# Patient Record
Sex: Female | Born: 1960
Health system: Southern US, Community
[De-identification: ages and names within clinical notes are randomized; demographics above are authoritative.]

## PROBLEM LIST (undated history)

## (undated) DIAGNOSIS — Z87898 Personal history of other specified conditions: Secondary | ICD-10-CM

## (undated) DIAGNOSIS — Z87448 Personal history of other diseases of urinary system: Secondary | ICD-10-CM

## (undated) DIAGNOSIS — I10 Essential (primary) hypertension: Secondary | ICD-10-CM

## (undated) DIAGNOSIS — K219 Gastro-esophageal reflux disease without esophagitis: Secondary | ICD-10-CM

## (undated) DIAGNOSIS — M1990 Unspecified osteoarthritis, unspecified site: Secondary | ICD-10-CM

## (undated) HISTORY — PX: DILATION AND CURETTAGE OF UTERUS: SHX78

## (undated) HISTORY — DX: Essential (primary) hypertension: I10

## (undated) HISTORY — DX: Personal history of other diseases of urinary system: Z87.448

## (undated) HISTORY — DX: Gastro-esophageal reflux disease without esophagitis: K21.9

## (undated) HISTORY — DX: Personal history of other specified conditions: Z87.898

---

## 1998-01-13 HISTORY — PX: LAPAROSCOPIC OVARIAN CYSTECTOMY: SUR786

## 1999-01-14 HISTORY — PX: DILATION AND CURETTAGE OF UTERUS: SHX78

## 2005-11-20 ENCOUNTER — Ambulatory Visit (HOSPITAL_COMMUNITY): Admission: RE | Admit: 2005-11-20 | Discharge: 2005-11-20 | Payer: Self-pay | Admitting: Family Medicine

## 2008-02-11 ENCOUNTER — Encounter: Admission: RE | Admit: 2008-02-11 | Discharge: 2008-02-11 | Payer: Self-pay | Admitting: Family Medicine

## 2008-12-08 ENCOUNTER — Emergency Department (HOSPITAL_COMMUNITY): Admission: EM | Admit: 2008-12-08 | Discharge: 2008-12-08 | Payer: Self-pay | Admitting: Emergency Medicine

## 2010-02-03 ENCOUNTER — Encounter: Payer: Self-pay | Admitting: Family Medicine

## 2010-04-17 LAB — PREGNANCY, URINE: Preg Test, Ur: NEGATIVE

## 2010-04-17 LAB — URINALYSIS, ROUTINE W REFLEX MICROSCOPIC
Glucose, UA: NEGATIVE mg/dL
Leukocytes, UA: NEGATIVE
Protein, ur: NEGATIVE mg/dL
Specific Gravity, Urine: 1.015 (ref 1.005–1.030)
Urobilinogen, UA: 0.2 mg/dL (ref 0.0–1.0)

## 2010-04-17 LAB — CBC
MCHC: 33.9 g/dL (ref 30.0–36.0)
RBC: 4.51 MIL/uL (ref 3.87–5.11)
WBC: 16.2 10*3/uL — ABNORMAL HIGH (ref 4.0–10.5)

## 2010-04-17 LAB — DIFFERENTIAL
Basophils Relative: 0 % (ref 0–1)
Eosinophils Absolute: 0.1 10*3/uL (ref 0.0–0.7)
Eosinophils Relative: 0 % (ref 0–5)
Lymphs Abs: 2.5 10*3/uL (ref 0.7–4.0)
Monocytes Relative: 6 % (ref 3–12)
Neutro Abs: 12.5 10*3/uL — ABNORMAL HIGH (ref 1.7–7.7)

## 2010-04-17 LAB — COMPREHENSIVE METABOLIC PANEL
BUN: 8 mg/dL (ref 6–23)
CO2: 23 mEq/L (ref 19–32)
Creatinine, Ser: 0.83 mg/dL (ref 0.4–1.2)
Glucose, Bld: 97 mg/dL (ref 70–99)
Potassium: 3.2 mEq/L — ABNORMAL LOW (ref 3.5–5.1)
Sodium: 135 mEq/L (ref 135–145)
Total Bilirubin: 0.4 mg/dL (ref 0.3–1.2)
Total Protein: 8.2 g/dL (ref 6.0–8.3)

## 2010-04-17 LAB — LIPASE, BLOOD: Lipase: 17 U/L (ref 11–59)

## 2010-04-17 LAB — URINE MICROSCOPIC-ADD ON

## 2010-06-17 ENCOUNTER — Other Ambulatory Visit: Payer: Self-pay | Admitting: Family Medicine

## 2010-06-17 DIAGNOSIS — Z1231 Encounter for screening mammogram for malignant neoplasm of breast: Secondary | ICD-10-CM

## 2010-07-03 ENCOUNTER — Ambulatory Visit
Admission: RE | Admit: 2010-07-03 | Discharge: 2010-07-03 | Disposition: A | Discharge status: Home / Self Care | Payer: BC Managed Care – PPO | Source: Ambulatory Visit | Attending: Family Medicine | Admitting: Family Medicine

## 2010-07-03 DIAGNOSIS — Z1231 Encounter for screening mammogram for malignant neoplasm of breast: Secondary | ICD-10-CM

## 2010-08-22 ENCOUNTER — Encounter (HOSPITAL_COMMUNITY): Payer: Self-pay | Admitting: *Deleted

## 2010-09-05 ENCOUNTER — Ambulatory Visit (HOSPITAL_COMMUNITY): Payer: BC Managed Care – PPO | Admitting: Anesthesiology

## 2010-09-05 ENCOUNTER — Encounter (HOSPITAL_COMMUNITY): Payer: Self-pay | Admitting: Anesthesiology

## 2010-09-05 ENCOUNTER — Ambulatory Visit (HOSPITAL_COMMUNITY)
Admission: RE | Admit: 2010-09-05 | Discharge: 2010-09-05 | Disposition: A | Discharge status: Home / Self Care | Payer: BC Managed Care – PPO | Source: Ambulatory Visit | Attending: Obstetrics & Gynecology | Admitting: Obstetrics & Gynecology

## 2010-09-05 ENCOUNTER — Other Ambulatory Visit: Payer: Self-pay | Admitting: Obstetrics & Gynecology

## 2010-09-05 ENCOUNTER — Encounter (HOSPITAL_COMMUNITY)
Admission: RE | Disposition: A | Discharge status: Home / Self Care | Payer: Self-pay | Source: Ambulatory Visit | Attending: Obstetrics & Gynecology

## 2010-09-05 DIAGNOSIS — N840 Polyp of corpus uteri: Secondary | ICD-10-CM | POA: Insufficient documentation

## 2010-09-05 DIAGNOSIS — N920 Excessive and frequent menstruation with regular cycle: Secondary | ICD-10-CM | POA: Insufficient documentation

## 2010-09-05 DIAGNOSIS — Z30430 Encounter for insertion of intrauterine contraceptive device: Secondary | ICD-10-CM | POA: Insufficient documentation

## 2010-09-05 HISTORY — DX: Unspecified osteoarthritis, unspecified site: M19.90

## 2010-09-05 HISTORY — PX: HYSTEROSCOPY WITH D & C: SHX1775

## 2010-09-05 HISTORY — PX: IUD REMOVAL: SHX5392

## 2010-09-05 LAB — CBC
Hemoglobin: 12 g/dL (ref 12.0–15.0)
MCHC: 32.9 g/dL (ref 30.0–36.0)
MCV: 89.2 fL (ref 78.0–100.0)
Platelets: 387 10*3/uL (ref 150–400)
RDW: 14.4 % (ref 11.5–15.5)

## 2010-09-05 LAB — PREGNANCY, URINE: Preg Test, Ur: NEGATIVE

## 2010-09-05 SURGERY — DILATATION AND CURETTAGE /HYSTEROSCOPY
Anesthesia: General | Site: Uterus | Wound class: Clean Contaminated

## 2010-09-05 MED ORDER — KETOROLAC TROMETHAMINE 30 MG/ML IJ SOLN
INTRAMUSCULAR | Status: AC
  Administered 2010-09-05: 30 mg via INTRAVENOUS
  Filled 2010-09-05: qty 1

## 2010-09-05 MED ORDER — ONDANSETRON HCL 4 MG/2ML IJ SOLN
INTRAMUSCULAR | Status: DC | PRN
  Administered 2010-09-05: 4 mg via INTRAVENOUS

## 2010-09-05 MED ORDER — FENTANYL CITRATE 0.05 MG/ML IJ SOLN
INTRAMUSCULAR | Status: AC
  Filled 2010-09-05: qty 2

## 2010-09-05 MED ORDER — MIDAZOLAM HCL 5 MG/5ML IJ SOLN
INTRAMUSCULAR | Status: DC | PRN
  Administered 2010-09-05: 2 mg via INTRAVENOUS

## 2010-09-05 MED ORDER — LIDOCAINE HCL (CARDIAC) 20 MG/ML IV SOLN
INTRAVENOUS | Status: DC | PRN
  Administered 2010-09-05: 50 mg via INTRAVENOUS

## 2010-09-05 MED ORDER — LACTATED RINGERS IV SOLN
INTRAVENOUS | Status: DC
  Administered 2010-09-05 (×2): via INTRAVENOUS

## 2010-09-05 MED ORDER — DEXAMETHASONE SODIUM PHOSPHATE 10 MG/ML IJ SOLN
INTRAMUSCULAR | Status: AC
  Filled 2010-09-05: qty 1

## 2010-09-05 MED ORDER — ONDANSETRON HCL 4 MG/2ML IJ SOLN
INTRAMUSCULAR | Status: AC
  Filled 2010-09-05: qty 2

## 2010-09-05 MED ORDER — PROPOFOL 10 MG/ML IV EMUL
INTRAVENOUS | Status: AC
  Filled 2010-09-05: qty 20

## 2010-09-05 MED ORDER — FENTANYL CITRATE 0.05 MG/ML IJ SOLN
INTRAMUSCULAR | Status: DC | PRN
  Administered 2010-09-05 (×2): 50 ug via INTRAVENOUS

## 2010-09-05 MED ORDER — PROPOFOL 10 MG/ML IV EMUL
INTRAVENOUS | Status: DC | PRN
  Administered 2010-09-05: 200 mg via INTRAVENOUS

## 2010-09-05 MED ORDER — FENTANYL CITRATE 0.05 MG/ML IJ SOLN: 25.0000 ug | INTRAMUSCULAR | Status: DC | PRN

## 2010-09-05 MED ORDER — LIDOCAINE HCL 1 % IJ SOLN
INTRAMUSCULAR | Status: DC | PRN
  Administered 2010-09-05: 10 mL

## 2010-09-05 MED ORDER — KETOROLAC TROMETHAMINE 30 MG/ML IJ SOLN
30.0000 mg | INTRAMUSCULAR | Status: AC
  Administered 2010-09-05: 30 mg via INTRAVENOUS

## 2010-09-05 MED ORDER — LIDOCAINE HCL (CARDIAC) 20 MG/ML IV SOLN
INTRAVENOUS | Status: AC
  Filled 2010-09-05: qty 5

## 2010-09-05 MED ORDER — MIDAZOLAM HCL 2 MG/2ML IJ SOLN
INTRAMUSCULAR | Status: AC
  Filled 2010-09-05: qty 2

## 2010-09-05 MED ORDER — GLYCINE 1.5 % IR SOLN
Status: DC | PRN
  Administered 2010-09-05: 3000 mL

## 2010-09-05 SURGICAL SUPPLY — 20 items
CANISTER SUCTION 2500CC (MISCELLANEOUS) ×4 IMPLANT
CATH ROBINSON RED A/P 16FR (CATHETERS) ×4 IMPLANT
CLOTH BEACON ORANGE TIMEOUT ST (SAFETY) ×4 IMPLANT
CONTAINER PREFILL 10% NBF 60ML (FORM) ×4 IMPLANT
DRAPE UTILITY XL STRL (DRAPES) ×8 IMPLANT
ELECT REM PT RETURN 9FT ADLT (ELECTROSURGICAL) ×4
ELECTRODE REM PT RTRN 9FT ADLT (ELECTROSURGICAL) ×3 IMPLANT
ELECTRODE ROLLER BARREL 22FR (ELECTROSURGICAL) IMPLANT
ELECTRODE ROLLER VERSAPOINT (ELECTRODE) IMPLANT
ELECTRODE RT ANGLE VERSAPOINT (CUTTING LOOP) IMPLANT
ELECTRODE VAPORCUT 22FR (ELECTROSURGICAL) IMPLANT
GLOVE BIO SURGEON STRL SZ7 (GLOVE) ×8 IMPLANT
GLOVE BIOGEL PI IND STRL 7.0 (GLOVE) ×3 IMPLANT
GLOVE BIOGEL PI INDICATOR 7.0 (GLOVE) ×1
GOWN PREVENTION PLUS LG XLONG (DISPOSABLE) ×8 IMPLANT
LOOP ANGLED CUTTING 22FR (CUTTING LOOP) IMPLANT
PACK HYSTEROSCOPY LF (CUSTOM PROCEDURE TRAY) ×4 IMPLANT
TOWEL OR 17X24 6PK STRL BLUE (TOWEL DISPOSABLE) ×8 IMPLANT
WATER STERILE IRR 1000ML POUR (IV SOLUTION) ×4 IMPLANT
intrauterine device (Coils) ×4 IMPLANT

## 2010-09-05 NOTE — Anesthesia Procedure Notes (Signed)
Procedure Name: LMA Insertion Date/Time: 09/05/2010 12:16 PM Performed by: Madison Hickman Pre-anesthesia Checklist: Patient identified, Emergency Drugs available, Suction available, Patient being monitored and Timeout performed Patient Re-evaluated:Patient Re-evaluated prior to inductionOxygen Delivery Method: Circle System Utilized Preoxygenation: Pre-oxygenation with 100% oxygen Intubation Type: IV induction Ventilation: Mask ventilation without difficulty LMA Size: 3.0 Placement Confirmation: positive ETCO2 and breath sounds checked- equal and bilateral Dental Injury: Teeth and Oropharynx as per pre-operative assessment

## 2010-09-05 NOTE — Op Note (Signed)
Preop diagnosis: Menorrhagia,  endometrial polyp, Mirena IUD desired for contraception as well.  Postop diagnosis: same Procedure:  D&C, hysteroscopic polypectomy, Mirena IUD insertion Surgeon Dr. Shea Evans, MD Asst. None Anesthesia Gen. And paracervical block IV fluids LR EBL minimal Irrigation fluid deficit minimal Complications none Condition stable Disposition PACU Specimen endometrial polyp and endometrial curettings  Procedure Patient is 50 year old woman with menorrhagia and ultrasound noting endometrial polyp. Patient also desired contraception.  Options for menorrhagia as well as contraception options reviewed; decision was made to proceed with Mirena IUD insertion after polypectomy. Percent completion of surgery including infection bleeding damage to internal organs perforation were reviewed. Patient given phone consent she was brought to the operating room with IV running. She underwent general anesthesia without difficulty and was given dorsal lithotomy position. Parts prepped and draped in standard fashion. Bladder was emptied with straight catheter. Examination under anesthesia revealed anteverted normal size uterus. Speculum inserted in the vagina, Cervix was grasped with single-tooth tenaculum, Uterus sounded to 8 cm. Paracervical block with 1% plain lidocaine was given (total 10 cc). Cervical os was dilated to #23 [place] dilator. Hysteroscope was introduced in the uterine cavity under visualization. Fundal endometrial polyp was noted. Rest of endometrium appeared normal with normal appearing tubal ostia. Cervical canal appear normal.  Hysteroscopic polypectomy was done with scissors, polyp was grasped with polyp forceps, and brought out along with the hysteroscope. Specimen endometrial polyp. Curetting of the endometrial cavity was done with sharp curette, specimen sent to pathology. Mirena IUD was inserted in standard fashion at the fundus. Strings were cut short to you about  2-1/2 cm at the cervical os.  All instruments were removed. All counts correct x2.  No complications. Patient tolerated surgery well  Patient was recover from anesthesia brought to the recovery room in stable condition. Surgical findings reviewed with patient and her husband. Followup in office in 2 weeks.

## 2010-09-05 NOTE — Transfer of Care (Signed)
  Anesthesia Post-op Note  Patient: Emily Aguirre  Procedure(s) Performed:  INTRAUTERINE DEVICE (IUD) REMOVAL - Mirena IUD insertion.; DILATATION AND CURETTAGE (D&C) /HYSTEROSCOPY - Dilataion & Currettage With Hysteroscopy; Polypectomy  Patient Location: PACU  Anesthesia Type: General  Level of Consciousness: awake, alert  and oriented  Airway and Oxygen Therapy: Patient Spontanous Breathing and Patient connected to nasal cannula oxygen  Post-op Pain: none  Post-op Assessment: Post-op Vital signs reviewed and Patient's Cardiovascular Status Stable  Post-op Vital Signs: Reviewed and stable  Complications: No apparent anesthesia complications

## 2010-09-05 NOTE — Anesthesia Preprocedure Evaluation (Signed)
Anesthesia Evaluation  Name, MR# and DOB Patient awake  General Assessment Comment  Reviewed: Allergy & Precautions, H&P , NPO status , Patient's Chart, lab work & pertinent test results, reviewed documented beta blocker date and time   History of Anesthesia Complications Negative for: history of anesthetic complications  Airway Mallampati: II TM Distance: >3 FB Neck ROM: full    Dental  (+) Teeth Intact,    Pulmonary  clear to auscultation  breath sounds clear to auscultation none    Cardiovascular Exercise Tolerance: Good regular Normal    Neuro/Psych Negative Neurological ROS  Negative Psych ROS  GI/Hepatic/Renal negative GI ROS  negative Liver ROS  negative Renal ROS        Endo/Other  Negative Endocrine ROS (+)   Morbid obesity  Abdominal   Musculoskeletal  (+) Arthritis - (bilateral knees), Osteoarthritis,    Hematology negative hematology ROS (+)   Peds  Reproductive/Obstetrics negative OB ROS    Anesthesia Other Findings             Anesthesia Physical Anesthesia Plan  ASA: II  Anesthesia Plan: General   Post-op Pain Management:    Induction:   Airway Management Planned: LMA  Additional Equipment:   Intra-op Plan:   Post-operative Plan:   Informed Consent: I have reviewed the patients History and Physical, chart, labs and discussed the procedure including the risks, benefits and alternatives for the proposed anesthesia with the patient or authorized representative who has indicated his/her understanding and acceptance.   Dental advisory given  Plan Discussed with: CRNA and Surgeon  Anesthesia Plan Comments:         Anesthesia Quick Evaluation

## 2010-09-05 NOTE — H&P (Signed)
Emily Aguirre is an 49 y.o. female. With menorrhagia. Sono noted possible submucosal myoma v/s polyp. Options reviewed, desired Mirena IUD or contraceptive benefit as well.  No prior PID/ abn paps. Prior ovarian cyst L/scopy and D&C for menorrhagia.  No breast complaints, normal Mammo in past.    Menstrual History: Heavy,, clots, but regular, every month.  Patient's last menstrual period was 08/12/2010.    Past Medical History  Diagnosis Date  . Arthritis     bil knees    Past Surgical History  Procedure Date  . Laparoscopic ovarian cystectomy 2000  . Dilation and curettage of uterus 2001    D&E    History reviewed. No pertinent family history.  Social History:  reports that she has never smoked. She does not have any smokeless tobacco history on file. She reports that she does not drink alcohol or use illicit drugs.  Allergies: No Known Allergies  Prescriptions prior to admission  Medication Sig Dispense Refill  . amoxicillin (AMOXIL) 500 MG capsule Take 500 mg by mouth every 6 (six) hours. Taking this after a root canal.  Should be completed on 08/25/10       . ergocalciferol (VITAMIN D2) 50000 UNITS capsule Take 50,000 Units by mouth once a week. Every Tuesday        . HYDROcodone-acetaminophen (VICODIN) 5-500 MG per tablet Take 1 tablet by mouth every 6 (six) hours as needed. For root canal pain       . ibuprofen (ADVIL,MOTRIN) 800 MG tablet Take 800 mg by mouth every 8 (eight) hours as needed. For tooth pain after root canal       . medroxyPROGESTERone (PROVERA) 5 MG tablet Take 5 mg by mouth daily. Until surgery         Denies SOB/chest pain/ HA/ vision changes/ LE pain or swelling/ etc.  Blood pressure 150/82, pulse 89, temperature 97.9 F (36.6 C), temperature source Oral, resp. rate 16, height 5\' 1"  (1.549 m), weight 92.534 kg (204 lb), last menstrual period 08/12/2010, SpO2 99.00%. Physical exam:  A&O x 3, no acute distress. Pleasant HEENT neg, no  thyromegaly Lungs CTA bilat CV RRR, A1S2 normal Abdo soft, non tender, non acute Extr no edema/ tenderness Pelvic Slightly enlarged uterus with submucosal myoma/polyp  Results for orders placed during the hospital encounter of 09/05/10 (from the past 24 hour(s))  CBC     Status: Normal   Collection Time   09/05/10  8:52 AM      Component Value Range   WBC 7.9  4.0 - 10.5 (K/uL)   RBC 4.09  3.87 - 5.11 (MIL/uL)   Hemoglobin 12.0  12.0 - 15.0 (g/dL)   HCT 24.4  01.0 - 27.2 (%)   MCV 89.2  78.0 - 100.0 (fL)   MCH 29.3  26.0 - 34.0 (pg)   MCHC 32.9  30.0 - 36.0 (g/dL)   RDW 53.6  64.4 - 03.4 (%)   Platelets 387  150 - 400 (K/uL)  PREGNANCY, URINE     Status: Normal   Collection Time   09/05/10  9:02 AM      Component Value Range   Preg Test, Ur NEGATIVE      No results found.  Assessment/Plan: Menorrhagia. Plan D&C, hysteroscopy, resection of myoma/polyp and Mirena IUD insertion.  Risks/complications/benefits and alternatives reviewed, patient understands and agree.s   Emily Aguirre 09/05/2010, 11:41 AM

## 2010-09-05 NOTE — Anesthesia Postprocedure Evaluation (Signed)
Anesthesia Post Note  Patient: Emily Aguirre  Procedure(s) Performed:  INTRAUTERINE DEVICE (IUD) REMOVAL - Mirena IUD insertion.; DILATATION AND CURETTAGE (D&C) /HYSTEROSCOPY - Dilataion & Currettage With Hysteroscopy; Polypectomy  Anesthesia type: GA  Patient location: PACU  Post pain: Pain level controlled  Post assessment: Post-op Vital signs reviewed  Last Vitals:  Filed Vitals:   09/05/10 1315  BP: 115/65  Pulse: 82  Temp:   Resp: 20    Post vital signs: Reviewed  Level of consciousness: sedated  Complications: No apparent anesthesia complications

## 2010-09-24 ENCOUNTER — Encounter (HOSPITAL_COMMUNITY): Payer: Self-pay | Admitting: Obstetrics & Gynecology

## 2012-04-16 ENCOUNTER — Encounter (HOSPITAL_COMMUNITY): Payer: Self-pay | Admitting: Emergency Medicine

## 2012-04-16 ENCOUNTER — Emergency Department (HOSPITAL_COMMUNITY)
Admission: EM | Admit: 2012-04-16 | Discharge: 2012-04-16 | Disposition: A | Discharge status: Home / Self Care | Payer: 59 | Attending: Emergency Medicine | Admitting: Emergency Medicine

## 2012-04-16 DIAGNOSIS — Z8739 Personal history of other diseases of the musculoskeletal system and connective tissue: Secondary | ICD-10-CM | POA: Insufficient documentation

## 2012-04-16 DIAGNOSIS — R197 Diarrhea, unspecified: Secondary | ICD-10-CM | POA: Insufficient documentation

## 2012-04-16 DIAGNOSIS — Z79899 Other long term (current) drug therapy: Secondary | ICD-10-CM | POA: Insufficient documentation

## 2012-04-16 DIAGNOSIS — R319 Hematuria, unspecified: Secondary | ICD-10-CM | POA: Insufficient documentation

## 2012-04-16 DIAGNOSIS — R112 Nausea with vomiting, unspecified: Secondary | ICD-10-CM

## 2012-04-16 DIAGNOSIS — N390 Urinary tract infection, site not specified: Secondary | ICD-10-CM

## 2012-04-16 DIAGNOSIS — R109 Unspecified abdominal pain: Secondary | ICD-10-CM | POA: Insufficient documentation

## 2012-04-16 LAB — URINALYSIS, ROUTINE W REFLEX MICROSCOPIC
Glucose, UA: NEGATIVE mg/dL
Ketones, ur: NEGATIVE mg/dL
Protein, ur: NEGATIVE mg/dL
pH: 6.5 (ref 5.0–8.0)

## 2012-04-16 LAB — URINE MICROSCOPIC-ADD ON

## 2012-04-16 MED ORDER — ONDANSETRON HCL 4 MG/2ML IJ SOLN
4.0000 mg | Freq: Once | INTRAMUSCULAR | Status: AC
  Administered 2012-04-16: 4 mg via INTRAVENOUS
  Filled 2012-04-16: qty 2

## 2012-04-16 MED ORDER — HYDROMORPHONE HCL PF 1 MG/ML IJ SOLN
1.0000 mg | Freq: Once | INTRAMUSCULAR | Status: AC
  Administered 2012-04-16: 1 mg via INTRAVENOUS
  Filled 2012-04-16: qty 1

## 2012-04-16 MED ORDER — ONDANSETRON 4 MG PO TBDP: 4.0000 mg | ORAL_TABLET | Freq: Three times a day (TID) | ORAL | Status: DC | PRN

## 2012-04-16 MED ORDER — ONDANSETRON HCL 4 MG/2ML IJ SOLN
4.0000 mg | Freq: Once | INTRAMUSCULAR | Status: DC
  Filled 2012-04-16: qty 2

## 2012-04-16 MED ORDER — CIPROFLOXACIN HCL 250 MG PO TABS
500.0000 mg | ORAL_TABLET | Freq: Once | ORAL | Status: AC
  Administered 2012-04-16: 500 mg via ORAL
  Filled 2012-04-16: qty 2

## 2012-04-16 MED ORDER — ONDANSETRON HCL 4 MG/2ML IJ SOLN
4.0000 mg | Freq: Once | INTRAMUSCULAR | Status: AC
  Administered 2012-04-16: 4 mg via INTRAVENOUS

## 2012-04-16 MED ORDER — SODIUM CHLORIDE 0.9 % IV BOLUS (SEPSIS)
1000.0000 mL | Freq: Once | INTRAVENOUS | Status: AC
  Administered 2012-04-16: 1000 mL via INTRAVENOUS

## 2012-04-16 MED ORDER — HYDROCODONE-ACETAMINOPHEN 5-325 MG PO TABS: 1.0000 | ORAL_TABLET | ORAL | Status: DC | PRN

## 2012-04-16 MED ORDER — CIPROFLOXACIN HCL 250 MG PO TABS: 250.0000 mg | ORAL_TABLET | Freq: Two times a day (BID) | ORAL | Status: DC

## 2012-04-16 MED ORDER — SODIUM CHLORIDE 0.9 % IV SOLN: Freq: Once | INTRAVENOUS | Status: DC

## 2012-04-16 NOTE — ED Provider Notes (Signed)
History     CSN: [id number]  Arrival date & time 04/16/12  0251   First MD Initiated Contact with Patient 04/16/12 [phone]      Chief Complaint  Patient presents with  . Urinary Tract Infection    (Consider location/radiation/quality/duration/timing/severity/associated sxs/prior treatment) HPI  Past Medical History  Diagnosis Date  . Arthritis     bil knees    Past Surgical History  Procedure Laterality Date  . Laparoscopic ovarian cystectomy  2000  . Dilation and curettage of uterus  2001    D&E  . Iud removal  09/05/2010    Procedure: INTRAUTERINE DEVICE (IUD) REMOVAL;  Surgeon: Robley Fries;  Location: [facility];  Service: Gynecology;  Laterality: N/A;  Mirena IUD insertion.  Marland Kitchen Hysteroscopy w/d&c  09/05/2010    Procedure: DILATATION AND CURETTAGE (D&C) /HYSTEROSCOPY;  Surgeon: Robley Fries;  Location: [facility];  Service: Gynecology;  Laterality: Bilateral;  Dilataion & Currettage With Hysteroscopy; Polypectomy    No family history on file.  History  Substance Use Topics  . Smoking status: Never Smoker   . Smokeless tobacco: Not on file  . Alcohol Use: No    OB History   Grav Para Term Preterm Abortions TAB SAB Ect Mult Living                  Review of Systems  Allergies  Review of patient's allergies indicates no known allergies.  Home Medications   Current Outpatient Rx  Name  Route  Sig  Dispense  Refill  . ergocalciferol (VITAMIN D2) 50000 UNITS capsule   Oral   Take 50,000 Units by mouth once a week. Every Tuesday           . ibuprofen (ADVIL,MOTRIN) 800 MG tablet   Oral   Take 800 mg by mouth every 8 (eight) hours as needed. For tooth pain after root canal            BP 135/80  Pulse 119  Temp(Src) 99.3 F (37.4 C) (Oral)  Resp 20  Ht 5\' 1"  (1.549 m)  Wt 218 lb (98.884 kg)  BMI 41.21 kg/m2  SpO2 99%  LMP 04/02/2012  Physical Exam  ED Course  Procedures (including critical care time) Results for orders placed during the  hospital encounter of 04/16/12  URINALYSIS, ROUTINE W REFLEX MICROSCOPIC      Result Value Range   Color, Urine YELLOW  YELLOW   APPearance CLEAR  CLEAR   Specific Gravity, Urine 1.010  1.005 - 1.030   pH 6.5  5.0 - 8.0   Glucose, UA NEGATIVE  NEGATIVE mg/dL   Hgb urine dipstick LARGE (*) NEGATIVE   Bilirubin Urine NEGATIVE  NEGATIVE   Ketones, ur NEGATIVE  NEGATIVE mg/dL   Protein, ur NEGATIVE  NEGATIVE mg/dL   Urobilinogen, UA 0.2  0.0 - 1.0 mg/dL   Nitrite NEGATIVE  NEGATIVE   Leukocytes, UA SMALL (*) NEGATIVE  URINE MICROSCOPIC-ADD ON      Result Value Range   Squamous Epithelial / LPF RARE  RARE   WBC, UA TOO NUMEROUS TO COUNT  <3 WBC/hpf   RBC / HPF 3-6  <3 RBC/hpf   Bacteria, UA RARE  RARE      0445 Patient with continued nausea. Has been given dilaudid and zofran. Will give additional zofran. Pain is gone. 0532 Nausea is better after the second dose of zofran. MDM  Patient with abdominal pain, headache, nausea, vomiting and diarrhea. UA with evidence of infection.  Given IVF, dilaudid, zofran x 2, ciprofloxacin. Reviewed results with patient. Pt stable in ED with no significant deterioration in condition.The patient appears reasonably screened and/or stabilized for discharge and I doubt any other medical condition or other [facility] requiring further screening, evaluation, or treatment in the ED at this time prior to discharge.  MDM Reviewed: nursing note and vitals Interpretation: labs           Nicoletta Dress. Colon Branch, MD 04/16/12 [phone]

## 2012-04-16 NOTE — ED Notes (Signed)
Pt c/o pain with urination, lower back pain, and nausea.

## 2012-04-16 NOTE — ED Provider Notes (Signed)
History     CSN: [id number]  Arrival date & time 04/16/12  0251   First MD Initiated Contact with Patient 04/16/12 [phone]      Chief Complaint  Patient presents with  . Urinary Tract Infection    (Consider location/radiation/quality/duration/timing/severity/associated sxs/prior treatment) HPI Emily Aguirre is a 52 y.o. female who presents to the Emergency Department complaining of  Right sided flank pain, urine with blood since Monday, nausea, and vomiting. Vomiting began tonight. Denies fever, chills.  PCP Dr. Tanya Nones  Past Medical History  Diagnosis Date  . Arthritis     bil knees    Past Surgical History  Procedure Laterality Date  . Laparoscopic ovarian cystectomy  2000  . Dilation and curettage of uterus  2001    D&E  . Iud removal  09/05/2010    Procedure: INTRAUTERINE DEVICE (IUD) REMOVAL;  Surgeon: Robley Fries;  Location: [facility];  Service: Gynecology;  Laterality: N/A;  Mirena IUD insertion.  Marland Kitchen Hysteroscopy w/d&c  09/05/2010    Procedure: DILATATION AND CURETTAGE (D&C) /HYSTEROSCOPY;  Surgeon: Robley Fries;  Location: [facility];  Service: Gynecology;  Laterality: Bilateral;  Dilataion & Currettage With Hysteroscopy; Polypectomy    No family history on file.  History  Substance Use Topics  . Smoking status: Never Smoker   . Smokeless tobacco: Not on file  . Alcohol Use: No    OB History   Grav Para Term Preterm Abortions TAB SAB Ect Mult Living                  Review of Systems  Constitutional: Negative for fever.       10 Systems reviewed and are negative for acute change except as noted in the HPI.  HENT: Negative for congestion.   Eyes: Negative for discharge and redness.  Respiratory: Negative for cough and shortness of breath.   Cardiovascular: Negative for chest pain.  Gastrointestinal: Negative for vomiting and abdominal pain.  Genitourinary: Positive for hematuria.       Right sided pain  Musculoskeletal: Negative for back pain.  Skin:  Negative for rash.  Neurological: Negative for syncope, numbness and headaches.  Psychiatric/Behavioral:       No behavior change.    Allergies  Review of patient's allergies indicates no known allergies.  Home Medications   Current Outpatient Rx  Name  Route  Sig  Dispense  Refill  . ergocalciferol (VITAMIN D2) 50000 UNITS capsule   Oral   Take 50,000 Units by mouth once a week. Every Tuesday           . ibuprofen (ADVIL,MOTRIN) 800 MG tablet   Oral   Take 800 mg by mouth every 8 (eight) hours as needed. For tooth pain after root canal            BP 135/80  Pulse 119  Temp(Src) 99.3 F (37.4 C) (Oral)  Resp 20  Ht 5\' 1"  (1.549 m)  Wt 218 lb (98.884 kg)  BMI 41.21 kg/m2  SpO2 99%  LMP 04/02/2012  Physical Exam  Nursing note and vitals reviewed. Constitutional:  Awake, alert, nontoxic appearance.  HENT:  Head: Atraumatic.  Neck: Neck supple.  Cardiovascular: Normal rate and intact distal pulses.   Pulmonary/Chest: Effort normal and breath sounds normal. She exhibits no tenderness.  Abdominal: Soft. Bowel sounds are normal. There is no rebound.  Right sided pain with palpation  Musculoskeletal: She exhibits no tenderness.  Baseline ROM, no obvious new focal weakness.  Neurological:  Mental status and motor strength appears baseline for patient and situation.  Skin: No rash noted.  Psychiatric: She has a normal mood and affect.    ED Course  Procedures (including critical care time) Results for orders placed during the hospital encounter of 04/16/12  URINALYSIS, ROUTINE W REFLEX MICROSCOPIC      Result Value Range   Color, Urine YELLOW  YELLOW   APPearance CLEAR  CLEAR   Specific Gravity, Urine 1.010  1.005 - 1.030   pH 6.5  5.0 - 8.0   Glucose, UA NEGATIVE  NEGATIVE mg/dL   Hgb urine dipstick LARGE (*) NEGATIVE   Bilirubin Urine NEGATIVE  NEGATIVE   Ketones, ur NEGATIVE  NEGATIVE mg/dL   Protein, ur NEGATIVE  NEGATIVE mg/dL   Urobilinogen, UA 0.2   0.0 - 1.0 mg/dL   Nitrite NEGATIVE  NEGATIVE   Leukocytes, UA SMALL (*) NEGATIVE  URINE MICROSCOPIC-ADD ON      Result Value Range   Squamous Epithelial / LPF RARE  RARE   WBC, UA TOO NUMEROUS TO COUNT  <3 WBC/hpf   RBC / HPF 3-6  <3 RBC/hpf   Bacteria, UA RARE  RARE      MDM  Patient with abdominal pain, nausea, vomiting and hematuria. UA supports UTI. Given IVF, dilaudid, zofran x2, ciprofloxacin. Reviewed results with patient.Pt stable in ED with no significant deterioration in condition.The patient appears reasonably screened and/or stabilized for discharge and I doubt any other medical condition or other [facility] requiring further screening, evaluation, or treatment in the ED at this time prior to discharge.  MDM Reviewed: nursing note and vitals Interpretation: labs           Nicoletta Dress. Colon Branch, MD 04/16/12 [phone]

## 2012-04-16 NOTE — ED Notes (Signed)
Pt up to use bedside commode with no difficulty. Holding on giving PO medications until nausea level decreases. Dr. Colon Branch notified.

## 2012-04-16 NOTE — ED Notes (Signed)
Patient c/o UTI symptoms; also c/o nausea.

## 2012-04-17 LAB — URINE CULTURE: Colony Count: 50000

## 2012-04-18 ENCOUNTER — Telehealth (HOSPITAL_COMMUNITY): Payer: Self-pay | Admitting: Emergency Medicine

## 2012-04-18 NOTE — ED Notes (Signed)
Patient has +Urine culture. Checking to see if appropriately treated. °

## 2012-04-18 NOTE — ED Notes (Signed)
+  Urine. Patient treated with Cipro. Sensitive to same. Per protocol MD. °

## 2012-04-19 LAB — URINE CULTURE: Colony Count: 95000

## 2012-04-20 ENCOUNTER — Telehealth (HOSPITAL_COMMUNITY): Payer: Self-pay | Admitting: Emergency Medicine

## 2012-09-22 ENCOUNTER — Encounter: Payer: Self-pay | Admitting: Physician Assistant

## 2012-09-22 ENCOUNTER — Ambulatory Visit (INDEPENDENT_AMBULATORY_CARE_PROVIDER_SITE_OTHER): Payer: 59 | Admitting: Physician Assistant

## 2012-09-22 VITALS — BP 132/88 | HR 68 | Temp 98.4°F | Resp 18 | Ht 61.0 in | Wt 227.0 lb

## 2012-09-22 DIAGNOSIS — K219 Gastro-esophageal reflux disease without esophagitis: Secondary | ICD-10-CM | POA: Insufficient documentation

## 2012-09-22 DIAGNOSIS — M79609 Pain in unspecified limb: Secondary | ICD-10-CM

## 2012-09-22 DIAGNOSIS — M79672 Pain in left foot: Secondary | ICD-10-CM

## 2012-09-22 DIAGNOSIS — R42 Dizziness and giddiness: Secondary | ICD-10-CM

## 2012-09-22 LAB — CBC WITH DIFFERENTIAL/PLATELET
Basophils Absolute: 0.1 10*3/uL (ref 0.0–0.1)
Basophils Relative: 1 % (ref 0–1)
Eosinophils Absolute: 0.2 10*3/uL (ref 0.0–0.7)
Eosinophils Relative: 2 % (ref 0–5)
HCT: 38 % (ref 36.0–46.0)
Lymphocytes Relative: 26 % (ref 12–46)
MCHC: 33.4 g/dL (ref 30.0–36.0)
MCV: 85.2 fL (ref 78.0–100.0)
Monocytes Absolute: 0.9 10*3/uL (ref 0.1–1.0)
Platelets: 453 10*3/uL — ABNORMAL HIGH (ref 150–400)
RDW: 14.9 % (ref 11.5–15.5)
WBC: 10.1 10*3/uL (ref 4.0–10.5)

## 2012-09-22 LAB — COMPLETE METABOLIC PANEL WITH GFR
BUN: 13 mg/dL (ref 6–23)
CO2: 27 mEq/L (ref 19–32)
Calcium: 9.3 mg/dL (ref 8.4–10.5)
Chloride: 105 mEq/L (ref 96–112)
Creat: 0.9 mg/dL (ref 0.50–1.10)
GFR, Est African American: 87 mL/min
GFR, Est Non African American: 75 mL/min
Glucose, Bld: 85 mg/dL (ref 70–99)

## 2012-09-22 LAB — TSH: TSH: 2.613 u[IU]/mL (ref 0.350–4.500)

## 2012-09-22 MED ORDER — OMEPRAZOLE 20 MG PO CPDR: 20.0000 mg | DELAYED_RELEASE_CAPSULE | Freq: Every day | ORAL | Status: DC

## 2012-09-22 MED ORDER — MELOXICAM 7.5 MG PO TABS: 7.5000 mg | ORAL_TABLET | Freq: Every day | ORAL | Status: DC

## 2012-09-22 NOTE — Progress Notes (Signed)
Patient ID: BRENYA TAULBEE MRN: [id number], DOB: [birth year], 52 y.o. Date of Encounter: @DATE @  Chief Complaint:  Chief Complaint  Patient presents with  . c/o dizziness, heartburn, lt foot swelling    HPI: 52 y.o. year old AA female  presents for evaluation of above complaints.  Her last office visit here was with Dr. Jerilynn Birkenhead in November 2012. She says she sees her gynecologist once a year. Otherwise she's had no other medical care since her last visit here in 2012.  She report ports that she has had heartburn off and on over the last 2 months. She notices that after eating certain foods including chocolate. It only occurs occasionally. She has not had much problems with reflux at night. She does try to avoid eating or drinking just prior to going to bed. She really has not noticed tasting or feeling acid coming up into her throat but more just a heartburn sensation in her chest.  He reports that she has recently been having some pain on the dorsum aspect of her left foot near the toes. She has had no trauma or injury. 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 She stands for the entire shift on concrete floor and wears steel toed shoes. She had been prescribed medication for similar pain in the past and was wanting to get on some medication.  She also reports that she's had some episodes of lightheadedness over the past one week. She's never had any true vertigo or true spinning sensation. She says she had one episode this past Sunday it occurred when she went from a sitting position to standing. Another specific episode she reports was yesterday and when she was leaving the lunch room. Again she had been sitting and stood up to leave the lunch room - when she felt the symptoms. She had just had something to eat and drink so do not think her symptoms are related to low blood sugars. Also she reports that yesterday she felt a little bit lightheaded. At that time she then standing  next to a heater at work in addition she had been standing all day it was towards the end of the shift. With all of these episodes she has had no palpitations, shortness of breath, or presyncope/syncope. Just slight lightheaded feeling.   Past Medical History  Diagnosis Date  . Arthritis     bil knees  . GERD (gastroesophageal reflux disease)   . H/O hematuria   . H/O abnormal Pap smear      Home Meds: See attached medication section for current medication list. Any medications entered into computer today will not appear on this note's list. The medications listed below were entered prior to today. Current Outpatient Prescriptions on File Prior to Visit  Medication Sig Dispense Refill  . ibuprofen (ADVIL,MOTRIN) 800 MG tablet Take 800 mg by mouth every 8 (eight) hours as needed. For tooth pain after root canal       . ergocalciferol (VITAMIN D2) 50000 UNITS capsule Take 50,000 Units by mouth once a week. Every Tuesday        . HYDROcodone-acetaminophen (NORCO/VICODIN) 5-325 MG per tablet Take 1 tablet by mouth every 4 (four) hours as needed for pain.  15 tablet  0  . ondansetron (ZOFRAN ODT) 4 MG disintegrating tablet Take 1 tablet (4 mg total) by mouth every 8 (eight) hours as needed for nausea.  20 tablet  0   No current facility-administered medications on file prior to visit.  Allergies: No Known Allergies  History   Social History  . Marital Status: Divorced    Spouse Name: N/A    Number of Children: N/A  . Years of Education: N/A   Occupational History  . Not on file.   Social History Main Topics  . Smoking status: Never Smoker   . Smokeless tobacco: Never Used  . Alcohol Use: No  . Drug Use: No  . Sexual Activity: Yes    Birth Control/ Protection: Condom   Other Topics Concern  . Not on file   Social History Narrative  . No narrative on file    No family history on file.   Review of Systems:  See HPI for pertinent ROS. All other ROS negative.     Physical Exam: Blood pressure 132/88, pulse 68, temperature 98.4 F (36.9 C), temperature source Oral, resp. rate 18, height 5\' 1"  (1.549 m), weight 227 lb (102.967 kg)., Body mass index is 42.91 kg/(m^2). General: Obese African American female .Appears in no acute distress.  Neck: Supple. No thyromegaly. No lymphadenopathy. No carotid bruit. Lungs: Clear bilaterally to auscultation without wheezes, rales, or rhonchi. Breathing is unlabored. Heart: RRR with S1 S2. No murmurs, rubs, or gallops. Abdomen: Soft, non-tender, non-distended with normoactive bowel sounds. No hepatomegaly. No rebound/guarding. No obvious abdominal masses. No epigastric pain with palpation Musculoskeletal:  Strength and tone normal for age. Exam of the left foot is normal. There is no area of tenderness with palpation. There is no edema. No areas of discoloration including any erythema or ecchymosis. Extremities/Skin: Warm and dry. No edema. Neuro: Alert and oriented X 3. Moves all extremities spontaneously. Gait is normal. CNII-XII grossly in tact. Psych:  Responds to questions appropriately with a normal affect.     ASSESSMENT AND PLAN:  52 y.o. year old female with  1. GERD (gastroesophageal reflux disease) Try to avoid foods that are causing the reflux. She can use the omeprazole as needed. Continue to avoid eating and drinking for several hours prior to lying in the bed to sleep. - omeprazole (PRILOSEC) 20 MG capsule; Take 1 capsule (20 mg total) by mouth daily.  Dispense: 30 capsule; Refill: 3  2. Left foot pain - meloxicam (MOBIC) 7.5 MG tablet; Take 1 tablet (7.5 mg total) by mouth daily.  Dispense: 30 tablet; Refill: 0  3. Lightheadedness I discussed with her that it sounds like most all of her episodes have occurred when she goes from sitting to standing. She needs to make sure that she is keeping hydrated and drinking plenty of water and fluids. Needs to stand up slowly. We'll check labs to rule out  any underlying medical cause. - CBC with Differential - COMPLETE METABOLIC PANEL WITH GFR - TSH Followup if these symptoms continue or worsen.  I discussed possibly scheduling a complete physical.  However she says that she just recently had a lipid profile done through a screening at work. Says this is excellent .  She sees a gynecologist annually.   Murray Hodgkins [place], [place], [facility] 09/22/2012 2:47 PM

## 2013-03-18 ENCOUNTER — Ambulatory Visit (INDEPENDENT_AMBULATORY_CARE_PROVIDER_SITE_OTHER): Payer: 59 | Admitting: Family Medicine

## 2013-03-18 ENCOUNTER — Encounter: Payer: Self-pay | Admitting: Family Medicine

## 2013-03-18 VITALS — BP 130/88 | HR 76 | Temp 98.3°F | Resp 14 | Ht 60.5 in | Wt 232.0 lb

## 2013-03-18 DIAGNOSIS — Z13220 Encounter for screening for lipoid disorders: Secondary | ICD-10-CM

## 2013-03-18 DIAGNOSIS — R197 Diarrhea, unspecified: Secondary | ICD-10-CM

## 2013-03-18 DIAGNOSIS — Z1211 Encounter for screening for malignant neoplasm of colon: Secondary | ICD-10-CM

## 2013-03-18 LAB — COMPREHENSIVE METABOLIC PANEL
ALBUMIN: 3.9 g/dL (ref 3.5–5.2)
ALK PHOS: 81 U/L (ref 39–117)
ALT: 15 U/L (ref 0–35)
AST: 17 U/L (ref 0–37)
BUN: 13 mg/dL (ref 6–23)
CO2: 26 meq/L (ref 19–32)
Calcium: 9.1 mg/dL (ref 8.4–10.5)
Chloride: 104 mEq/L (ref 96–112)
Creat: 0.87 mg/dL (ref 0.50–1.10)
GLUCOSE: 77 mg/dL (ref 70–99)
POTASSIUM: 4.3 meq/L (ref 3.5–5.3)
SODIUM: 140 meq/L (ref 135–145)
TOTAL PROTEIN: 7.1 g/dL (ref 6.0–8.3)
Total Bilirubin: 0.4 mg/dL (ref 0.2–1.2)

## 2013-03-18 LAB — LIPID PANEL
CHOLESTEROL: 190 mg/dL (ref 0–200)
HDL: 65 mg/dL (ref >39)
LDL Cholesterol: 114 mg/dL — ABNORMAL HIGH (ref 0–99)
TRIGLYCERIDES: 55 mg/dL (ref <150)
Total CHOL/HDL Ratio: 2.9 Ratio
VLDL: 11 mg/dL (ref 0–40)

## 2013-03-18 LAB — CBC WITH DIFFERENTIAL/PLATELET
BASOS PCT: 0 % (ref 0–1)
Basophils Absolute: 0 10*3/uL (ref 0.0–0.1)
EOS ABS: 0.2 10*3/uL (ref 0.0–0.7)
Eosinophils Relative: 2 % (ref 0–5)
HEMATOCRIT: 37.7 % (ref 36.0–46.0)
HEMOGLOBIN: 12.3 g/dL (ref 12.0–15.0)
LYMPHS ABS: 2.4 10*3/uL (ref 0.7–4.0)
Lymphocytes Relative: 28 % (ref 12–46)
MCH: 27 pg (ref 26.0–34.0)
MCHC: 32.6 g/dL (ref 30.0–36.0)
MCV: 82.9 fL (ref 78.0–100.0)
MONO ABS: 0.7 10*3/uL (ref 0.1–1.0)
MONOS PCT: 8 % (ref 3–12)
NEUTROS ABS: 5.2 10*3/uL (ref 1.7–7.7)
NEUTROS PCT: 62 % (ref 43–77)
Platelets: 440 10*3/uL — ABNORMAL HIGH (ref 150–400)
RBC: 4.55 MIL/uL (ref 3.87–5.11)
RDW: 15.4 % (ref 11.5–15.5)
WBC: 8.4 10*3/uL (ref 4.0–10.5)

## 2013-03-18 LAB — AMYLASE: Amylase: 43 U/L (ref 0–105)

## 2013-03-18 MED ORDER — DIPHENOXYLATE-ATROPINE 2.5-0.025 MG PO TABS: 1.0000 | ORAL_TABLET | Freq: Four times a day (QID) | ORAL | Status: DC | PRN

## 2013-03-18 NOTE — Patient Instructions (Signed)
Take the lomotil as prescribed Referral to GI for colonoscopy We will call with lab results F/U as needed

## 2013-03-18 NOTE — Progress Notes (Signed)
Patient ID: Emily Aguirre, female   DOB: 04-Dec-1960, 53 y.o.   MRN: [id number]     Subjective:    Patient ID: Emily BarrowAlberta Y Zufall, female    DOB: 04-Dec-1960, 53 y.o.   MRN: [id number]  Patient presents for Gallbladder Issues  Pt here with diarrhea x 2 months, occurs 30 minutes after she eats but multiple times. No regular medications.  She has multiple bouts of watery to loose diarrhea after eating. She's tried eliminating some dairy products but still has the diarrhea. She has cramping right before the diarrhea but otherwise no abdominal pain no nausea vomiting. She has not lost any significant amount of weight. There is no family history of Crohn's or other inflammatory bowel disorder. She's not had any blood in her stool. She's not had a colonoscopy. She's not been on any recent antibiotics per    Review Of Systems:  GEN- denies fatigue, fever, weight loss,weakness, recent illness HEENT- denies eye drainage, change in vision, nasal discharge, CVS- denies chest pain, palpitations RESP- denies SOB, cough, wheeze ABD- denies N/V, +change in stools, abd pain GU- denies dysuria, hematuria, dribbling, incontinence MSK- denies joint pain, muscle aches, injury Neuro- denies headache, dizziness, syncope, seizure activity       Objective:    BP 130/88  Pulse 76  Temp(Src) 98.3 F (36.8 C)  Resp 14  Ht 5' 0.5" (1.537 m)  Wt 232 lb (105.235 kg)  BMI 44.55 kg/m2 GEN- NAD, alert and oriented x3 HEENT- PERRL, EOMI, non injected sclera, pink conjunctiva, MMM, oropharynx clear CVS- RRR, no murmur RESP-CTAB ABD-NABS,soft,NT,ND EXT- No edema Pulses- Radial, DP- 2+        Assessment & Plan:      Problem List Items Addressed This Visit   Diarrhea - Primary      Note: This dictation was prepared with Dragon dictation along with smaller phrase technology. Any transcriptional errors that result from this process are unintentional.

## 2013-03-20 NOTE — Assessment & Plan Note (Signed)
Possible it no bowel syndrome versus inflammatory process. She's not been on any antibiotics to suggest infectious etiology. Her check some basic labs. I will give her Lomotil as it occurs multiple times a day and she is unable to work like this. She will be referred to GI as she needs a colonoscopy as well.

## 2013-04-01 ENCOUNTER — Other Ambulatory Visit: Payer: Self-pay

## 2013-04-01 DIAGNOSIS — Z1231 Encounter for screening mammogram for malignant neoplasm of breast: Secondary | ICD-10-CM

## 2013-04-08 ENCOUNTER — Ambulatory Visit
Admission: RE | Admit: 2013-04-08 | Discharge: 2013-04-08 | Disposition: A | Discharge status: Home / Self Care | Payer: 59 | Source: Ambulatory Visit

## 2013-04-08 DIAGNOSIS — Z1231 Encounter for screening mammogram for malignant neoplasm of breast: Secondary | ICD-10-CM

## 2013-04-22 ENCOUNTER — Telehealth: Payer: Self-pay | Admitting: Gastroenterology

## 2013-04-22 ENCOUNTER — Encounter (INDEPENDENT_AMBULATORY_CARE_PROVIDER_SITE_OTHER): Payer: Self-pay

## 2013-04-22 ENCOUNTER — Encounter: Payer: Self-pay | Admitting: Gastroenterology

## 2013-04-22 ENCOUNTER — Ambulatory Visit (INDEPENDENT_AMBULATORY_CARE_PROVIDER_SITE_OTHER): Payer: 59 | Admitting: Gastroenterology

## 2013-04-22 VITALS — BP 159/90 | HR 80 | Temp 97.6°F | Ht 61.0 in | Wt 237.8 lb

## 2013-04-22 DIAGNOSIS — R197 Diarrhea, unspecified: Secondary | ICD-10-CM

## 2013-04-22 DIAGNOSIS — Z800 Family history of malignant neoplasm of digestive organs: Secondary | ICD-10-CM | POA: Insufficient documentation

## 2013-04-22 MED ORDER — PEG 3350-KCL-NA BICARB-NACL 420 G PO SOLR: 4000.0000 mL | ORAL | Status: DC

## 2013-04-22 NOTE — Telephone Encounter (Signed)
Pt refused photo

## 2013-04-22 NOTE — Assessment & Plan Note (Signed)
Recent diarrhea, resolved. Back at baseline. Family history of colon cancer and a paternal great uncle and possibly paternal grandmother. No prior colonoscopy. Recommend colonoscopy in the near future.  I have discussed the risks, alternatives, benefits with regards to but not limited to the risk of reaction to medication, bleeding, infection, perforation and the patient is agreeable to proceed. Written consent to be obtained.

## 2013-04-22 NOTE — Telephone Encounter (Signed)
Noted  

## 2013-04-22 NOTE — Progress Notes (Signed)
Primary Care Physician:  Leo Grosser, MD  Primary Gastroenterologist:  Jonette Eva, MD   Chief Complaint  Patient presents with  . Diarrhea  . Colonoscopy    HPI:  Emily Aguirre is a 53 y.o. female here for further evaluation of two month history of diarrhea. At baseline usually 2-3 BMs per day. Back in February she began having more frequent and loose stools with fecal urgency especially postprandially. She was seen by Dr. Jeanice Lim and started on Lomotil.  Symptoms improved so stopped Lomotil. Stools better now. Has changed diet some. Trying to eat more healthy. Dairy elimination did not help. Occasional heartburn. Uses Prilosec when necessary. No vomiting, no unintentional weight loss, dysphagia.   Current Outpatient Prescriptions  Medication Sig Dispense Refill  . aspirin 325 MG tablet Take 325 mg by mouth daily.      . polyethylene glycol-electrolytes (TRILYTE) 420 G solution Take 4,000 mLs by mouth as directed.  4000 mL  0   No current facility-administered medications for this visit.    Allergies as of 04/22/2013  . (No Known Allergies)    Past Medical History  Diagnosis Date  . Arthritis     bil knees  . GERD (gastroesophageal reflux disease)   . H/O hematuria   . H/O abnormal Pap smear     Past Surgical History  Procedure Laterality Date  . Laparoscopic ovarian cystectomy  2000  . Dilation and curettage of uterus  2001    D&E  . Iud removal  09/05/2010    Procedure: INTRAUTERINE DEVICE (IUD) REMOVAL;  Surgeon: Robley Fries;  Location: [facility];  Service: Gynecology;  Laterality: N/A;  Mirena IUD insertion.  Marland Kitchen Hysteroscopy w/d&c  09/05/2010    Procedure: DILATATION AND CURETTAGE (D&C) /HYSTEROSCOPY;  Surgeon: Robley Fries;  Location: [facility];  Service: Gynecology;  Laterality: Bilateral;  Dilataion & Currettage With Hysteroscopy; Polypectomy  . Dilation and curettage of uterus      x 2  due to misscarriages    Family History  Problem Relation Age of  Onset  . Colon cancer Other     paternal great uncle  . Colon cancer Other     ????paternal grandmother    History   Social History  . Marital Status: Divorced    Spouse Name: N/A    Number of Children: 0  . Years of Education: N/A   Occupational History  .     Social History Main Topics  . Smoking status: Never Smoker   . Smokeless tobacco: Never Used  . Alcohol Use: No  . Drug Use: No  . Sexual Activity: Yes    Birth Control/ Protection: Condom   Other Topics Concern  . Not on file   Social History Narrative  . No narrative on file      ROS:  General: Negative for anorexia, weight loss, fever, chills, fatigue, weakness. Eyes: Negative for vision changes.  ENT: Negative for hoarseness, difficulty swallowing , nasal congestion. CV: Negative for chest pain, angina, palpitations, dyspnea on exertion, peripheral edema.  Respiratory: Negative for dyspnea at rest, dyspnea on exertion, cough, sputum, wheezing.  GI: See history of present illness. GU:  Negative for dysuria, hematuria, urinary incontinence, urinary frequency, nocturnal urination.  MS: Negative for joint pain, low back pain.  Derm: Negative for rash or itching.  Neuro: Negative for weakness, abnormal sensation, seizure, frequent headaches, memory loss, confusion.  Psych: Negative for anxiety, depression, suicidal ideation, hallucinations.  Endo: Negative for unusual weight change.  Heme: Negative for bruising or bleeding. Allergy: Negative for rash or hives.    Physical Examination:  BP 159/90  Pulse 80  Temp(Src) 97.6 F (36.4 C) (Oral)  Ht 5\' 1"  (1.549 m)  Wt 237 lb 12.8 oz (107.865 kg)  BMI 44.95 kg/m2   General: Well-nourished, well-developed in no acute distress.  Head: Normocephalic, atraumatic.   Eyes: Conjunctiva pink, no icterus. Mouth: Oropharyngeal mucosa moist and pink , no lesions erythema or exudate. Neck: Supple without thyromegaly, masses, or lymphadenopathy.  Lungs: Clear to  auscultation bilaterally.  Heart: Regular rate and rhythm, no murmurs rubs or gallops.  Abdomen: Bowel sounds are normal, nontender, nondistended, no hepatosplenomegaly or masses, no abdominal bruits or    hernia , no rebound or guarding.   Rectal: Deferred Extremities: No lower extremity edema. No clubbing or deformities.  Neuro: Alert and oriented x 4 , grossly normal neurologically.  Skin: Warm and dry, no rash or jaundice.   Psych: Alert and cooperative, normal mood and affect.  Labs: Lab Results  Component Value Date   WBC 8.4 03/18/2013   HGB 12.3 03/18/2013   HCT 37.7 03/18/2013   MCV 82.9 03/18/2013   PLT 440* 03/18/2013   Lab Results  Component Value Date   CREATININE 0.87 03/18/2013   BUN 13 03/18/2013   NA 140 03/18/2013   K 4.3 03/18/2013   CL 104 03/18/2013   CO2 26 03/18/2013   Lab Results  Component Value Date   ALT 15 03/18/2013   AST 17 03/18/2013   ALKPHOS 81 03/18/2013   BILITOT 0.4 03/18/2013   Lab Results  Component Value Date   TSH 2.613 09/22/2012      Imaging Studies: Mm Digital Screening Bilateral  04/08/2013   CLINICAL DATA:  Screening.  EXAM: DIGITAL SCREENING BILATERAL MAMMOGRAM WITH CAD  COMPARISON:  Previous exam(s).  ACR Breast Density Category c: The breast tissue is heterogeneously dense, which may obscure small masses.  FINDINGS: There are no findings suspicious for malignancy. Images were processed with CAD.  IMPRESSION: No mammographic evidence of malignancy. A result letter of this screening mammogram will be mailed directly to the patient.  RECOMMENDATION: Screening mammogram in one year. (Code:SM-B-01Y)  BI-RADS CATEGORY  1: Negative.   Electronically Signed   By: Annia Beltrew  Davis M.D.   On: 04/08/2013 13:57

## 2013-04-22 NOTE — Patient Instructions (Signed)
Colonoscopy with Dr. Darrick Pennafields as scheduled. Please see separate instructions.

## 2013-04-25 NOTE — Progress Notes (Signed)
cc'd to pcp 

## 2013-05-06 ENCOUNTER — Encounter (HOSPITAL_COMMUNITY): Payer: Self-pay | Admitting: Pharmacy Technician

## 2013-05-20 ENCOUNTER — Encounter (HOSPITAL_COMMUNITY)
Admission: RE | Disposition: A | Discharge status: Home / Self Care | Payer: Self-pay | Source: Ambulatory Visit | Attending: Gastroenterology

## 2013-05-20 ENCOUNTER — Ambulatory Visit (HOSPITAL_COMMUNITY)
Admission: RE | Admit: 2013-05-20 | Discharge: 2013-05-20 | Disposition: A | Discharge status: Home / Self Care | Payer: 59 | Source: Ambulatory Visit | Attending: Gastroenterology | Admitting: Gastroenterology

## 2013-05-20 ENCOUNTER — Encounter (HOSPITAL_COMMUNITY): Payer: Self-pay | Admitting: *Deleted

## 2013-05-20 DIAGNOSIS — Z800 Family history of malignant neoplasm of digestive organs: Secondary | ICD-10-CM

## 2013-05-20 DIAGNOSIS — K219 Gastro-esophageal reflux disease without esophagitis: Secondary | ICD-10-CM | POA: Insufficient documentation

## 2013-05-20 DIAGNOSIS — R197 Diarrhea, unspecified: Secondary | ICD-10-CM

## 2013-05-20 DIAGNOSIS — Z7982 Long term (current) use of aspirin: Secondary | ICD-10-CM | POA: Insufficient documentation

## 2013-05-20 DIAGNOSIS — K648 Other hemorrhoids: Secondary | ICD-10-CM | POA: Insufficient documentation

## 2013-05-20 DIAGNOSIS — Z1211 Encounter for screening for malignant neoplasm of colon: Secondary | ICD-10-CM | POA: Insufficient documentation

## 2013-05-20 DIAGNOSIS — IMO0002 Reserved for concepts with insufficient information to code with codable children: Secondary | ICD-10-CM

## 2013-05-20 DIAGNOSIS — M1710 Unilateral primary osteoarthritis, unspecified knee: Secondary | ICD-10-CM | POA: Insufficient documentation

## 2013-05-20 HISTORY — PX: COLONOSCOPY: SHX5424

## 2013-05-20 SURGERY — COLONOSCOPY
Anesthesia: Moderate Sedation

## 2013-05-20 MED ORDER — MIDAZOLAM HCL 5 MG/5ML IJ SOLN
INTRAMUSCULAR | Status: DC | PRN
  Administered 2013-05-20 (×3): 2 mg via INTRAVENOUS

## 2013-05-20 MED ORDER — SIMETHICONE 40 MG/0.6ML PO SUSP
ORAL | Status: DC | PRN
  Administered 2013-05-20: 09:00:00

## 2013-05-20 MED ORDER — MEPERIDINE HCL 100 MG/ML IJ SOLN
INTRAMUSCULAR | Status: AC
  Filled 2013-05-20: qty 2

## 2013-05-20 MED ORDER — MIDAZOLAM HCL 5 MG/5ML IJ SOLN
INTRAMUSCULAR | Status: AC
  Filled 2013-05-20: qty 10

## 2013-05-20 MED ORDER — SODIUM CHLORIDE 0.9 % IV SOLN
INTRAVENOUS | Status: DC
  Administered 2013-05-20: 08:00:00 via INTRAVENOUS

## 2013-05-20 MED ORDER — MEPERIDINE HCL 100 MG/ML IJ SOLN
INTRAMUSCULAR | Status: DC | PRN
  Administered 2013-05-20 (×3): 25 mg via INTRAVENOUS

## 2013-05-20 NOTE — H&P (Signed)
  Primary Care Physician:  Leo GrosserPICKARD,WARREN TOM, MD Primary Gastroenterologist:  Dr. Darrick PennaFields  Pre-Procedure History & Physical: HPI:  Emily Aguirre is a 53 y.o. female here for COLON CANCER SCREENING.  Past Medical History  Diagnosis Date  . Arthritis     bil knees  . GERD (gastroesophageal reflux disease)   . H/O hematuria   . H/O abnormal Pap smear     Past Surgical History  Procedure Laterality Date  . Laparoscopic ovarian cystectomy  2000  . Dilation and curettage of uterus  2001    D&E  . Iud removal  09/05/2010    Procedure: INTRAUTERINE DEVICE (IUD) REMOVAL;  Surgeon: Robley FriesVaishali R Mody;  Location: [facility];  Service: Gynecology;  Laterality: N/A;  Mirena IUD insertion.  Marland Kitchen. Hysteroscopy w/d&c  09/05/2010    Procedure: DILATATION AND CURETTAGE (D&C) /HYSTEROSCOPY;  Surgeon: Robley FriesVaishali R Mody;  Location: [facility];  Service: Gynecology;  Laterality: Bilateral;  Dilataion & Currettage With Hysteroscopy; Polypectomy  . Dilation and curettage of uterus      x 2  due to misscarriages    Prior to Admission medications   Medication Sig Start Date End Date Taking? Authorizing Provider  aspirin 325 MG tablet Take 325 mg by mouth daily.   Yes Historical Provider, MD  polyethylene glycol-electrolytes (TRILYTE) 420 G solution Take 4,000 mLs by mouth as directed. 04/22/13  Yes West BaliSandi L Halina Asano, MD    Allergies as of 04/22/2013  . (No Known Allergies)    Family History  Problem Relation Age of Onset  . Colon cancer Other     paternal great uncle    History   Social History  . Marital Status: Married    Spouse Name: N/A    Number of Children: 0  . Years of Education: N/A   Occupational History  .     Social History Main Topics  . Smoking status: Never Smoker   . Smokeless tobacco: Never Used  . Alcohol Use: No  . Drug Use: No  . Sexual Activity: Yes    Birth Control/ Protection: Condom   Other Topics Concern  . Not on file   Social History Narrative  . No narrative on file     Review of Systems: See HPI, otherwise negative ROS   Physical Exam: BP 148/81  Pulse 84  Temp(Src) 98 F (36.7 C) (Oral)  Resp 12  Ht 5\' 1"  (1.549 m)  Wt 237 lb (107.502 kg)  BMI 44.80 kg/m2  SpO2 100% General:   Alert,  pleasant and cooperative in NAD Head:  Normocephalic and atraumatic. Neck:  Supple; Lungs:  Clear throughout to auscultation.    Heart:  Regular rate and rhythm. Abdomen:  Soft, nontender and nondistended. Normal bowel sounds, without guarding, and without rebound.   Neurologic:  Alert and  oriented x4;  grossly normal neurologically.  Impression/Plan:     SCREENING  Plan:  1. TCS TODAY

## 2013-05-20 NOTE — Progress Notes (Signed)
REVIEWED.  

## 2013-05-20 NOTE — Discharge Instructions (Signed)
You have internal hemorrhoids. YOU DID NOT HAVE ANY POLYPS. ° ° °CONTINUE YOUR WEIGHT LOSS EFFORTS. OBESITY INCREASES YOUR RISK FOR ALL CANCER, ESPECIALLY COLON CANCER. ° °DO NOT DRINK SWEET TEA, SODAS, KOOL-AID, OR GREEN TEA. DRINK WATER TO KEEP YOUR URINE LIGHT YELLOW. ° °FOLLOW A LOW FAT/HIGH FIBER DIET. AVOID ITEMS THAT CAUSE BLOATING. SEE INFO BELOW. ° °USE PREPARATION H AS NEEDED FOR RECTAL PAIN, PRESSURE, ITCHING, OR BLEEDING ° °Next colonoscopy in 10 years. ° ° °Colonoscopy °Care After °Read the instructions outlined below and refer to this sheet in the next week. These discharge instructions provide you with general information on caring for yourself after you leave the hospital. While your treatment has been planned according to the most current medical practices available, unavoidable complications occasionally occur. If you have any problems or questions after discharge, call DR. Nakiah Osgood, [phone]. ° °ACTIVITY °· You may resume your regular activity, but move at a slower pace for the next 24 hours.  °· Take frequent rest periods for the next 24 hours.  °· Walking will help get rid of the air and reduce the bloated feeling in your belly (abdomen).  °· No driving for 24 hours (because of the medicine (anesthesia) used during the test).  °· You may shower.  °· Do not sign any important legal documents or operate any machinery for 24 hours (because of the anesthesia used during the test).  °·  °NUTRITION °· Drink plenty of fluids.  °· You may resume your normal diet as instructed by your doctor.  °· Begin with a light meal and progress to your normal diet. Heavy or fried foods are harder to digest and may make you feel sick to your stomach (nauseated).  °· Avoid alcoholic beverages for 24 hours or as instructed.  °·  °MEDICATIONS °· You may resume your normal medications. °·  °WHAT YOU CAN EXPECT TODAY °· Some feelings of bloating in the abdomen.  °· Passage of more gas than usual.  °· Spotting of blood  in your stool or on the toilet paper °· .  °IF YOU HAD POLYPS REMOVED DURING THE COLONOSCOPY: °· Eat a soft diet IF YOU HAVE NAUSEA, BLOATING, ABDOMINAL PAIN, OR VOMITING. °·   °FINDING OUT THE RESULTS OF YOUR TEST °Not all test results are available during your visit. DR. Syrah Daughtrey WILL CALL YOU WITHIN 7 DAYS OF YOUR PROCEDUE WITH YOUR RESULTS. Do not assume everything is normal if you have not heard from DR. Gaetan Spieker IN ONE WEEK, CALL HER OFFICE AT [phone]. ° °SEEK IMMEDIATE MEDICAL ATTENTION AND CALL THE OFFICE: [phone] IF: °· You have more than a spotting of blood in your stool.  °· Your belly is swollen (abdominal distention).  °· You are nauseated or vomiting.  °· You have a temperature over 101F.  °· You have abdominal pain or discomfort that is severe or gets worse throughout the day. ° °High-Fiber Diet °A high-fiber diet changes your normal diet to include more whole grains, legumes, fruits, and vegetables. Changes in the diet involve replacing refined carbohydrates with unrefined foods. The calorie level of the diet is essentially unchanged. The Dietary Reference Intake (recommended amount) for adult males is 38 grams per day. For adult females, it is 25 grams per day. Pregnant and lactating women should consume 28 grams of fiber per day. °Fiber is the intact part of a plant that is not broken down during digestion. Functional fiber is fiber that has been isolated from the plant to provide a   beneficial effect in the body. °PURPOSE °· Increase stool bulk.  °· Ease and regulate bowel movements.  °· Lower cholesterol.  °INDICATIONS THAT YOU NEED MORE FIBER °· Constipation and hemorrhoids.  °· Uncomplicated diverticulosis (intestine condition) and irritable bowel syndrome.  °· Weight management.  °· As a protective measure against hardening of the arteries (atherosclerosis), diabetes, and cancer.  ° °GUIDELINES FOR INCREASING FIBER IN THE DIET °· Start adding fiber to the diet slowly. A gradual increase  of about 5 more grams (2 slices of whole-wheat bread, 2 servings of most fruits or vegetables, or 1 bowl of high-fiber cereal) per day is best. Too rapid an increase in fiber may result in constipation, flatulence, and bloating.  °· Drink enough water and fluids to keep your urine clear or pale yellow. Water, juice, or caffeine-free drinks are recommended. Not drinking enough fluid may cause constipation.  °· Eat a variety of high-fiber foods rather than one type of fiber.  °· Try to increase your intake of fiber through using high-fiber foods rather than fiber pills or supplements that contain small amounts of fiber.  °· The goal is to change the types of food eaten. Do not supplement your present diet with high-fiber foods, but replace foods in your present diet.  °INCLUDE A VARIETY OF FIBER SOURCES °· Replace refined and processed grains with whole grains, canned fruits with fresh fruits, and incorporate other fiber sources. White rice, white breads, and most bakery goods contain little or no fiber.  °· Brown whole-grain rice, buckwheat oats, and many fruits and vegetables are all good sources of fiber. These include: broccoli, Brussels sprouts, cabbage, cauliflower, beets, sweet potatoes, white potatoes (skin on), carrots, tomatoes, eggplant, squash, berries, fresh fruits, and dried fruits.  °· Cereals appear to be the richest source of fiber. Cereal fiber is found in whole grains and bran. Bran is the fiber-rich outer coat of cereal grain, which is largely removed in refining. In whole-grain cereals, the bran remains. In breakfast cereals, the largest amount of fiber is found in those with "bran" in their names. The fiber content is sometimes indicated on the label.  °· You may need to include additional fruits and vegetables each day.  °· In baking, for 1 cup white flour, you may use the following substitutions:  °· 1 cup whole-wheat flour minus 2 tablespoons.  °· 1/2 cup white flour plus 1/2 cup whole-wheat  flour.  ° °Low-Fat Diet °BREADS, CEREALS, PASTA, RICE, DRIED PEAS, AND BEANS °These products are high in carbohydrates and most are low in fat. Therefore, they can be increased in the diet as substitutes for fatty foods. They too, however, contain calories and should not be eaten in excess. Cereals can be eaten for snacks as well as for breakfast.  ° °FRUITS AND VEGETABLES °It is good to eat fruits and vegetables. Besides being sources of fiber, both are rich in vitamins and some minerals. They help you get the daily allowances of these nutrients. Fruits and vegetables can be used for snacks and desserts. ° °MEATS °Limit lean meat, chicken, turkey, and fish to no more than 6 ounces per day. °Beef, Pork, and Lamb °Use lean cuts of beef, pork, and lamb. Lean cuts include:  °Extra-lean ground beef.  °Arm roast.  °Sirloin tip.  °Center-cut ham.  °Round steak.  °Loin chops.  °Rump roast.  °Tenderloin.  °Trim all fat off the outside of meats before cooking. It is not necessary to severely decrease the intake of red meat,   but lean choices should be made. Lean meat is rich in protein and contains a highly absorbable form of iron. Premenopausal women, in particular, should avoid reducing lean red meat because this could increase the risk for low red blood cells (iron-deficiency anemia). ° °Chicken and Turkey °These are good sources of protein. The fat of poultry can be reduced by removing the skin and underlying fat layers before cooking. Chicken and turkey can be substituted for lean red meat in the diet. Poultry should not be fried or covered with high-fat sauces. °Fish and Shellfish °Fish is a good source of protein. Shellfish contain cholesterol, but they usually are low in saturated fatty acids. The preparation of fish is important. Like chicken and turkey, they should not be fried or covered with high-fat sauces. °EGGS °Egg whites contain no fat or cholesterol. They can be eaten often. Try 1 to 2 egg whites instead of  whole eggs in recipes or use egg substitutes that do not contain yolk. °MILK AND DAIRY PRODUCTS °Use skim or 1% milk instead of 2% or whole milk. Decrease whole milk, natural, and processed cheeses. Use nonfat or low-fat (2%) cottage cheese or low-fat cheeses made from vegetable oils. Choose nonfat or low-fat (1 to 2%) yogurt. Experiment with evaporated skim milk in recipes that call for heavy cream. Substitute low-fat yogurt or low-fat cottage cheese for sour cream in dips and salad dressings. Have at least 2 servings of low-fat dairy products, such as 2 glasses of skim (or 1%) milk each day to help get your daily calcium intake. °FATS AND OILS °Reduce the total intake of fats, especially saturated fat. Butterfat, lard, and beef fats are high in saturated fat and cholesterol. These should be avoided as much as possible. Vegetable fats do not contain cholesterol, but certain vegetable fats, such as coconut oil, palm oil, and palm kernel oil are very high in saturated fats. These should be limited. These fats are often used in bakery goods, processed foods, popcorn, oils, and nondairy creamers. Vegetable shortenings and some peanut butters contain hydrogenated oils, which are also saturated fats. Read the labels on these foods and check for saturated vegetable oils. °Unsaturated vegetable oils and fats do not raise blood cholesterol. However, they should be limited because they are fats and are high in calories. Total fat should still be limited to 30% of your daily caloric intake. Desirable liquid vegetable oils are corn oil, cottonseed oil, olive oil, canola oil, safflower oil, soybean oil, and sunflower oil. Peanut oil is not as good, but small amounts are acceptable. Buy a heart-healthy tub margarine that has no partially hydrogenated oils in the ingredients. Mayonnaise and salad dressings often are made from unsaturated fats, but they should also be limited because of their high calorie and fat content. °Seeds,  nuts, peanut butter, olives, and avocados are high in fat, but the fat is mainly the unsaturated type. These foods should be limited mainly to avoid excess calories and fat. °OTHER EATING TIPS °Snacks  °Most sweets should be limited as snacks. They tend to be rich in calories and fats, and their caloric content outweighs their nutritional value. Some good choices in snacks are graham crackers, melba toast, soda crackers, bagels (no egg), English muffins, fruits, and vegetables. These snacks are preferable to snack crackers, French fries, TORTILLA CHIPS, and POTATO chips. Popcorn should be air-popped or cooked in small amounts of liquid vegetable oil. °Desserts °Eat fruit, low-fat yogurt, and fruit ices instead of pastries, cake, and cookies. Sherbet,   angel food cake, gelatin dessert, frozen low-fat yogurt, or other frozen products that do not contain saturated fat (pure fruit juice bars, frozen ice pops) are also acceptable.  °COOKING METHODS °Choose those methods that use little or no fat. They include: °Poaching.  °Braising.  °Steaming.  °Grilling.  °Baking.  °Stir-frying.  °Broiling.  °Microwaving.  °Foods can be cooked in a nonstick pan without added fat, or use a nonfat cooking spray in regular cookware. Limit fried foods and avoid frying in saturated fat. Add moisture to lean meats by using water, broth, cooking wines, and other nonfat or low-fat sauces along with the cooking methods mentioned above. °Soups and stews should be chilled after cooking. The fat that forms on top after a few hours in the refrigerator should be skimmed off. When preparing meals, avoid using excess salt. Salt can contribute to raising blood pressure in some people. ° °EATING AWAY FROM HOME °Order entrées, potatoes, and vegetables without sauces or butter. When meat exceeds the size of a deck of cards (3 to 4 ounces), the rest can be taken home for another meal. °Choose vegetable or fruit salads and ask for low-calorie salad dressings  to be served on the side. Use dressings sparingly. Limit high-fat toppings, such as bacon, crumbled eggs, cheese, sunflower seeds, and olives. Ask for heart-healthy tub margarine instead of butter. ° ° ° °Hemorrhoids °Hemorrhoids are dilated (enlarged) veins around the rectum. Sometimes clots will form in the veins. This makes them swollen and painful. These are called thrombosed hemorrhoids. °Causes of hemorrhoids include: °· Constipation.  °· Straining to have a bowel movement. °·  HEAVY LIFTING °HOME CARE INSTRUCTIONS °· Eat a well balanced diet and drink 6 to 8 glasses of water every day to avoid constipation. You may also use a bulk laxative.  °· Avoid straining to have bowel movements.  °· Keep anal area dry and clean.  °· Do not use a donut shaped pillow or sit on the toilet for long periods. This increases blood pooling and pain.  °· Move your bowels when your body has the urge; this will require less straining and will decrease pain and pressure.  °

## 2013-05-20 NOTE — Op Note (Signed)
[facility] [address] [place], [id number]   COLONOSCOPY PROCEDURE REPORT  PATIENT: Derl Aguirre, Emily Y.  MR#: [id number] BIRTHDATE: 1960-04-15 , 52  yrs. old GENDER: Female ENDOSCOPIST: Jonette EvaSandi Anay Walter, MD REFERRED XB:JYNWGNBY:Warren Tanya NonesPickard, M.D. PROCEDURE DATE:  05/20/2013 PROCEDURE:   Colonoscopy, screening INDICATIONS:Average risk patient for colon cancer. MEDICATIONS: Demerol 75 mg IV and Versed 6 mg IV  DESCRIPTION OF PROCEDURE:    Physical exam was performed.  Informed consent was obtained from the patient after explaining the benefits, risks, and alternatives to procedure.  The patient was connected to monitor and placed in left lateral position. Continuous oxygen was provided by nasal cannula and IV medicine administered through an indwelling cannula.  After administration of sedation and rectal exam, the patients rectum was intubated and the EC-[id number] ([id number]  colonoscope was advanced under direct visualization to the ileum.  The scope was removed slowly by carefully examining the color, texture, anatomy, and integrity mucosa on the way out.  The patient was recovered in endoscopy and discharged home in satisfactory condition.    COLON FINDINGS: The mucosa appeared normal in the terminal ileum.  , A normal appearing cecum, ileocecal valve, and appendiceal orifice were identified.  The ascending, hepatic flexure, transverse, splenic flexure, descending, sigmoid colon and rectum appeared unremarkable.  No polyps or cancers were seen.  , and Moderate sized internal hemorrhoids were found.  PREP QUALITY: excellent.CECAL W/D TIME: 10 minutes COMPLICATIONS: None  ENDOSCOPIC IMPRESSION: 1.   Normal mucosa in the terminal ileum 2.   Normal colon 3.   Moderate sized internal hemorrhoids  RECOMMENDATIONS: CONTINUE YOUR WEIGHT LOSS EFFORTS.  . DO NOT DRINK SWEET TEA, SODAS, KOOL-AID, OR GREEN TEA. DRINK WATER TO KEEP YOUR URINE LIGHT YELLOW. FOLLOW A LOW  FAT/HIGH FIBER DIET.  AVOID ITEMS THAT CAUSE BLOATING.  USE PREPARATION H AS NEEDED FOR RECTAL PAIN, PRESSURE, ITCHING, OR BLEEDING  Next colonoscopy in 10 years.       _______________________________ Rosalie DoctoreSignedJonette Eva:  Ojas Coone, MD 05/20/2013 7:47 PM

## 2013-05-24 ENCOUNTER — Encounter (HOSPITAL_COMMUNITY): Payer: Self-pay | Admitting: Gastroenterology

## 2014-01-11 ENCOUNTER — Encounter: Payer: Self-pay | Admitting: Family Medicine

## 2014-01-11 ENCOUNTER — Ambulatory Visit (INDEPENDENT_AMBULATORY_CARE_PROVIDER_SITE_OTHER): Payer: 59 | Admitting: Family Medicine

## 2014-01-11 VITALS — BP 146/88 | HR 78 | Temp 98.4°F | Resp 16 | Ht 61.0 in | Wt 233.0 lb

## 2014-01-11 DIAGNOSIS — J069 Acute upper respiratory infection, unspecified: Secondary | ICD-10-CM

## 2014-01-11 DIAGNOSIS — IMO0001 Reserved for inherently not codable concepts without codable children: Secondary | ICD-10-CM | POA: Insufficient documentation

## 2014-01-11 DIAGNOSIS — E669 Obesity, unspecified: Secondary | ICD-10-CM | POA: Insufficient documentation

## 2014-01-11 DIAGNOSIS — R030 Elevated blood-pressure reading, without diagnosis of hypertension: Secondary | ICD-10-CM

## 2014-01-11 MED ORDER — PHENTERMINE HCL 37.5 MG PO TABS: 37.5000 mg | ORAL_TABLET | Freq: Every day | ORAL | Status: DC

## 2014-01-11 MED ORDER — GUAIFENESIN-CODEINE 100-10 MG/5ML PO SOLN: ORAL | Status: DC

## 2014-01-11 NOTE — Progress Notes (Signed)
Patient ID: Emily Aguirre, female   DOB: [birth year], 53 y.o.   MRN: [id number]   Subjective:    Patient ID: Emily Aguirre, female    DOB: [birth year], 53 y.o.   MRN: [id number]  Patient presents for HTN and Cough   patient here with concern for obesity. She's been trying to lose weight states she is trying to change her diet and she has a treadmill at home which she has not started walking on. She would like to have something to help jump start a weight loss. She did use some type of weight loss pill a few years ago and lost weight.    She has been monitoring her blood pressures stays that it was little elevated when she went for her colonoscopy she also checked at home and was running a little high with the top number near 140. His is what prompted her to comment to discuss her dietary changes and weight loss.  She's had a cough with mild production and sore throat last week the sore throat and congestion has premature per she still has a bad cough at nighttime is unable to sleep like something to help with this. No fever no shortness of breath no wheezing    Review Of Systems:  GEN- denies fatigue, fever, weight loss,weakness, recent illness HEENT- denies eye drainage, change in vision, nasal discharge, CVS- denies chest pain, palpitations RESP- denies SOB, +cough, wheeze ABD- denies N/V, change in stools, abd pain GU- denies dysuria, hematuria, dribbling, incontinence MSK- denies joint pain, muscle aches, injury Neuro- denies headache, dizziness, syncope, seizure activity       Objective:    BP 146/88 mmHg  Pulse 78  Temp(Src) 98.4 F (36.9 C) (Oral)  Resp 16  Ht 5\' 1"  (1.549 m)  Wt 233 lb (105.688 kg)  BMI 44.05 kg/m2 GEN- NAD, alert and oriented x3, well appearing, obease  , repeat BP 140/96 HEENT- PERRL, EOMI, non injected sclera, pink conjunctiva, MMM, oropharynx clear, TM clear bilat Neck- Supple, no  LAD CVS- RRR, no murmur RESP-CTAB EXT- No edema Pulses-  Radial 2+        Assessment & Plan:      Problem List Items Addressed This Visit      Unprioritized   Morbid obesity   Relevant Medications      phentermine (ADIPEX-P) 37.5 MG tablet   Elevated blood pressure    Other Visit Diagnoses    Viral URI    -  Primary    Robitussin AC given,already improving, now with post viral cough       Note: This dictation was prepared with Dragon dictation along with smaller phrase technology. Any transcriptional errors that result from this process are unintentional.

## 2014-01-11 NOTE — Assessment & Plan Note (Signed)
BP was elevated at  GI office, previous BP within normal limits Will have her monitor at home, bring to next visit Work on lifestyle changes, if no improvement by next visit, will start HCTZ

## 2014-01-11 NOTE — Assessment & Plan Note (Addendum)
Long discussion regarding dietary changes low carb low fat diet with plans to be around 1500 cal. She's also to walk on her treadmill working her way up to 30 minutes a day 5 days a week. I'll start her on phentermine she is morbidly obese and already showing signs of some elevated blood pressure there is significant family history for blood pressure and other risk factors for heart disease. Return in 6 weeks

## 2014-01-11 NOTE — Patient Instructions (Signed)
Cough medicine as needed Monitor your blood pressure  Try the phentermine, take 1/2 tablet for 2 weeks then 1 tablet 1500 calories-- Low Carb, low sugar [employer]  Fruit and veggies, protein ( lean) chicken, fish, [place] , lean ground beef  Water up to 8 glasses a day Walking 30 minutes a day for 5 days F/U 6 weeks

## 2014-02-24 ENCOUNTER — Ambulatory Visit: Payer: 59 | Admitting: Family Medicine

## 2014-03-10 ENCOUNTER — Encounter: Payer: Self-pay | Admitting: Family Medicine

## 2014-03-10 ENCOUNTER — Ambulatory Visit (INDEPENDENT_AMBULATORY_CARE_PROVIDER_SITE_OTHER): Payer: 59 | Admitting: Family Medicine

## 2014-03-10 MED ORDER — PHENTERMINE HCL 30 MG PO TBDP: 1.0000 | ORAL_TABLET | Freq: Every day | ORAL | Status: DC

## 2014-03-10 NOTE — Patient Instructions (Signed)
Phentermine decreased to 30mg  to help with the insomnia Start protein shake for breakfast Healthy eating- fruits/veggies, lean meats Try to get 8 cups of water  F/U 8 weeks- PHYSICAL - FASTING

## 2014-03-10 NOTE — Assessment & Plan Note (Addendum)
Discussed low carb diet Plan add protein shake for breakfast supplement  Lean meats, veggies and fruit with each meal Cardio 30 minutes 5 days a week, she will work up to I will decrease phentermine to 30mg  to see if this helps with the insomnia She is highly motivated to lose weight Blood pressure looks great today, no meds needed

## 2014-03-10 NOTE — Progress Notes (Signed)
Patient ID: Emily Aguirre, female   DOB: 07/01/1960, 54 y.o.   MRN: [id number]   Subjective:    Patient ID: Emily BarrowAlberta Y Exantus, female    DOB: 07/01/1960, 54 y.o.   MRN: [id number]  Patient presents for F/U  Patient here for follow-up on phentermine and weight loss. She was started on phentermine about 6 weeks ago unfortunately when she went up to the full dose of 37.5 mg she started having insomnia she typically goes to bed around 8:00 and she has to get up at 4 AM. Says she has not been taking her medicine every day. She has lost 6 pounds since her last visit. She started off very well with her low-carb diet fruits and veggies however she started eating more red meats and snacking on crackers during the day. She typically does not eat a regular breakfast. She recently purchased a protein shake a one to make sure that this be appropriate as a meal replacement for her in the morning. Otherwise she's not had any side effects with the medications to palpitation or shortness of breath no headaches or nausea vomiting.  She was also here today for repeat blood pressure check   Review Of Systems:  GEN- denies fatigue, fever, weight loss,weakness, recent illness HEENT- denies eye drainage, change in vision, nasal discharge, CVS- denies chest pain, palpitations RESP- denies SOB, cough, wheeze ABD- denies N/V, change in stools, abd pain GU- denies dysuria, hematuria, dribbling, incontinence MSK- denies joint pain, muscle aches, injury Neuro- denies headache, dizziness, syncope, seizure activity       Objective:    BP 130/72 mmHg  Pulse 68  Temp(Src) 98.3 F (36.8 C) (Oral)  Resp 14  Ht 5\' 1"  (1.549 m)  Wt 227 lb (102.967 kg)  BMI 42.91 kg/m2 GEN- NAD, alert and oriented x3,obese CVS- RRR, no murmur RESP-CTAB EXT- No edema Pulses- Radial 2+        Assessment & Plan:      Problem List Items Addressed This Visit      Unprioritized   Morbid obesity - Primary    Discussed low  carb diet Plan add protein shake for breakfast supplement  Lean meats, veggies and fruit with each meal Cardio 30 minutes 5 days a week, she will work up to I will decrease phentermine to 30mg  to see if this helps with the insomnia She is highly motivated to lose weight Blood pressure looks great today, no meds needed      Relevant Medications   Phentermine HCl 30 MG TBDP      Note: This dictation was prepared with Dragon dictation along with smaller phrase technology. Any transcriptional errors that result from this process are unintentional.

## 2014-05-12 ENCOUNTER — Ambulatory Visit (INDEPENDENT_AMBULATORY_CARE_PROVIDER_SITE_OTHER): Payer: 59 | Admitting: Family Medicine

## 2014-05-12 ENCOUNTER — Encounter: Payer: Self-pay | Admitting: Family Medicine

## 2014-05-12 VITALS — BP 128/74 | HR 76 | Temp 98.2°F | Resp 12 | Ht 61.0 in | Wt 220.0 lb

## 2014-05-12 DIAGNOSIS — Z1239 Encounter for other screening for malignant neoplasm of breast: Secondary | ICD-10-CM

## 2014-05-12 DIAGNOSIS — Z0000 Encounter for general adult medical examination without abnormal findings: Secondary | ICD-10-CM | POA: Diagnosis not present

## 2014-05-12 DIAGNOSIS — Z23 Encounter for immunization: Secondary | ICD-10-CM

## 2014-05-12 LAB — CBC WITH DIFFERENTIAL/PLATELET
Basophils Absolute: 0 10*3/uL (ref 0.0–0.1)
Basophils Relative: 0 % (ref 0–1)
EOS ABS: 0.2 10*3/uL (ref 0.0–0.7)
Eosinophils Relative: 3 % (ref 0–5)
HCT: 37.1 % (ref 36.0–46.0)
Hemoglobin: 12.2 g/dL (ref 12.0–15.0)
LYMPHS ABS: 2.5 10*3/uL (ref 0.7–4.0)
LYMPHS PCT: 31 % (ref 12–46)
MCH: 27.5 pg (ref 26.0–34.0)
MCHC: 32.9 g/dL (ref 30.0–36.0)
MCV: 83.6 fL (ref 78.0–100.0)
MONOS PCT: 8 % (ref 3–12)
MPV: 8.8 fL (ref 8.6–12.4)
Monocytes Absolute: 0.6 10*3/uL (ref 0.1–1.0)
NEUTROS ABS: 4.6 10*3/uL (ref 1.7–7.7)
Neutrophils Relative %: 58 % (ref 43–77)
PLATELETS: 478 10*3/uL — AB (ref 150–400)
RBC: 4.44 MIL/uL (ref 3.87–5.11)
RDW: 15.2 % (ref 11.5–15.5)
WBC: 8 10*3/uL (ref 4.0–10.5)

## 2014-05-12 LAB — LIPID PANEL
Cholesterol: 213 mg/dL — ABNORMAL HIGH (ref 0–200)
HDL: 77 mg/dL (ref >=46)
LDL Cholesterol: 125 mg/dL — ABNORMAL HIGH (ref 0–99)
Total CHOL/HDL Ratio: 2.8 Ratio
Triglycerides: 54 mg/dL (ref <150)
VLDL: 11 mg/dL (ref 0–40)

## 2014-05-12 LAB — COMPREHENSIVE METABOLIC PANEL
ALT: 13 U/L (ref 0–35)
AST: 17 U/L (ref 0–37)
Albumin: 3.9 g/dL (ref 3.5–5.2)
Alkaline Phosphatase: 87 U/L (ref 39–117)
BILIRUBIN TOTAL: 0.3 mg/dL (ref 0.2–1.2)
BUN: 13 mg/dL (ref 6–23)
CO2: 25 meq/L (ref 19–32)
Calcium: 9.6 mg/dL (ref 8.4–10.5)
Chloride: 99 mEq/L (ref 96–112)
Creat: 0.91 mg/dL (ref 0.50–1.10)
GLUCOSE: 88 mg/dL (ref 70–99)
Potassium: 4.1 mEq/L (ref 3.5–5.3)
Sodium: 137 mEq/L (ref 135–145)
TOTAL PROTEIN: 7.8 g/dL (ref 6.0–8.3)

## 2014-05-12 LAB — TSH: TSH: 2.415 u[IU]/mL (ref 0.350–4.500)

## 2014-05-12 MED ORDER — PHENTERMINE HCL 30 MG PO TBDP: 1.0000 | ORAL_TABLET | Freq: Every day | ORAL | Status: DC

## 2014-05-12 NOTE — Progress Notes (Signed)
Patient ID: Emily Aguirre, female   DOB: [birth year], 54 y.o.   MRN: [id number]   Subjective:    Patient ID: Emily Aguirre, female    DOB: [birth year], 54 y.o.   MRN: [id number]  Patient presents for CPE  patient here for complete physical exam and labs. She's currently on phentermine for weight loss. She's not been taking daily percentile last visit 8 weeks ago she has lost 7 more pounds. She states that she got off of her diet and was eating a lot of sweets and she stopped walking on her treadmill but is now resumed her regular routine. We'll we discussed her eating habit she typically eats nuts for breakfast along with coffee at lunchtime she will typically have salad and then she has a reasonable dinner. She will have fruit for snacks. She is sleeping fairly well her joint pain is actually improved with some of the weight loss as well. Her blood pressure has been normal  She is followed by GYN and is scheduled to have her female exam done this summer. She is due for mammogram Due for TDAP    Review Of Systems:  GEN- denies fatigue, fever, weight loss,weakness, recent illness HEENT- denies eye drainage, change in vision, nasal discharge, CVS- denies chest pain, palpitations RESP- denies SOB, cough, wheeze ABD- denies N/V, change in stools, abd pain GU- denies dysuria, hematuria, dribbling, incontinence MSK- denies joint pain, muscle aches, injury Neuro- denies headache, dizziness, syncope, seizure activity       Objective:    BP 128/74 mmHg  Pulse 76  Temp(Src) 98.2 F (36.8 C) (Oral)  Resp 12  Ht 5\' 1"  (1.549 m)  Wt 220 lb (99.791 kg)  BMI 41.59 kg/m2 GEN- NAD, alert and oriented x3,obese HEENT- PERRL, EOMI, non injected sclera, pink conjunctiva, MMM, oropharynx clear Neck- Supple, no thyromegaly CVS- RRR, no murmur RESP-CTAB ABD-NABS,soft,NT,ND EXT- No edema Pulses- Radial, DP- 2+        Assessment & Plan:      Problem List Items Addressed This Visit    Morbid obesity    Continue with phentermine Discussed adding protein shake or greek yogurt smoothie for breakfast Can use whole wheat bread or whole wheat pasta Continue with water intake  Goal is 30lbs by next visit in 12 weeks      Relevant Medications   Phentermine HCl 30 MG TBDP    Other Visit Diagnoses    Breast cancer screening    -  Primary    Relevant Orders    MM DIGITAL SCREENING BILATERAL    Routine general medical examination at a health care facility        CPE done, PAP per GYN, Mammo to be scheduled, TDAP given     Relevant Orders    CBC with Differential/Platelet    Comprehensive metabolic panel    Lipid panel    TSH    Vitamin D, 25-hydroxy       Note: This dictation was prepared with Dragon dictation along with smaller phrase technology. Any transcriptional errors that result from this process are unintentional.

## 2014-05-12 NOTE — Assessment & Plan Note (Signed)
Continue with phentermine Discussed adding protein shake or greek yogurt smoothie for breakfast Can use whole wheat bread or whole wheat pasta Continue with water intake  Goal is 30lbs by next visit in 12 weeks

## 2014-05-12 NOTE — Patient Instructions (Addendum)
I recommend eye visit once a year I recommend dental visit every 6 months Goal is to  Exercise 30 minutes 5 days a week We will send a letter with lab results  F/U 3 months

## 2014-05-13 LAB — VITAMIN D 25 HYDROXY (VIT D DEFICIENCY, FRACTURES): VIT D 25 HYDROXY: 15 ng/mL — AB (ref 30–100)

## 2014-05-16 ENCOUNTER — Other Ambulatory Visit: Payer: Self-pay | Admitting: *Deleted

## 2014-05-16 MED ORDER — VITAMIN D (ERGOCALCIFEROL) 1.25 MG (50000 UNIT) PO CAPS: ORAL_CAPSULE | ORAL | Status: DC

## 2014-06-08 ENCOUNTER — Telehealth: Payer: Self-pay | Admitting: Family Medicine

## 2014-06-08 NOTE — Telephone Encounter (Signed)
Called and spoke with pharmacy.   Reports that Phentermine 30mg  caps is $67 for 30 day supply.   States that 37.5mg  tabs are $14 for 30 day supply.   Noted that patient had increased insomnia on tablets.   MD please advise.

## 2014-06-08 NOTE — Telephone Encounter (Signed)
Patient would like to know if dr Jeanice Limdurham can change her phentermine rx to tablets instead of a capsule she says it is cheaper for the tablets  [phone] [facility] [address]

## 2014-06-09 MED ORDER — PHENTERMINE HCL 37.5 MG PO TABS
18.7500 mg | ORAL_TABLET | Freq: Every day | ORAL | Status: DC
Start: 2014-06-09 — End: 2015-07-06

## 2014-06-09 NOTE — Telephone Encounter (Signed)
Advise tablet does not come in 30, so she will need to take the 37.5mg  and take 1/2 tablet once a day

## 2014-06-09 NOTE — Telephone Encounter (Signed)
Medication called to pharmacy.  Call placed to patient and patient made aware.  

## 2014-06-10 ENCOUNTER — Other Ambulatory Visit: Payer: Self-pay | Admitting: Family Medicine

## 2014-06-13 NOTE — Telephone Encounter (Signed)
Refill denied.   12 week course completed.  

## 2015-07-06 ENCOUNTER — Encounter: Payer: Self-pay | Admitting: Family Medicine

## 2015-07-06 ENCOUNTER — Other Ambulatory Visit: Payer: Self-pay | Admitting: Family Medicine

## 2015-07-06 ENCOUNTER — Ambulatory Visit (INDEPENDENT_AMBULATORY_CARE_PROVIDER_SITE_OTHER): Payer: 59 | Admitting: Family Medicine

## 2015-07-06 VITALS — BP 128/70 | HR 72 | Temp 98.2°F | Resp 14 | Ht 61.0 in | Wt 226.0 lb

## 2015-07-06 DIAGNOSIS — Z1231 Encounter for screening mammogram for malignant neoplasm of breast: Secondary | ICD-10-CM

## 2015-07-06 DIAGNOSIS — Z1239 Encounter for other screening for malignant neoplasm of breast: Secondary | ICD-10-CM

## 2015-07-06 DIAGNOSIS — L239 Allergic contact dermatitis, unspecified cause: Secondary | ICD-10-CM

## 2015-07-06 DIAGNOSIS — L200 Besnier's prurigo: Secondary | ICD-10-CM | POA: Diagnosis not present

## 2015-07-06 MED ORDER — HYDROCORTISONE 2.5 % EX CREA: TOPICAL_CREAM | Freq: Two times a day (BID) | CUTANEOUS | Status: DC

## 2015-07-06 MED ORDER — TRIAMCINOLONE ACETONIDE 0.1 % EX CREA: 1.0000 "application " | TOPICAL_CREAM | Freq: Two times a day (BID) | CUTANEOUS | Status: DC

## 2015-07-06 MED ORDER — METHYLPREDNISOLONE ACETATE 40 MG/ML IJ SUSP
40.0000 mg | Freq: Once | INTRAMUSCULAR | Status: AC
  Administered 2015-07-06: 40 mg via INTRAMUSCULAR

## 2015-07-06 NOTE — Progress Notes (Signed)
Patient ID: Emily Aguirre, female   DOB: [birth year], 55 y.o.   MRN: [id number]   Subjective:    Patient ID: Emily Aguirre, female    DOB: [birth year], 55 y.o.   MRN: [id number]  Patient presents for Skin Irritation Patient here with a rash that broke out around her mouth as well as a rash on her left forearm present for the past couple weeks. She does remember having an allergic reaction to some new eye makeup otherwise no other reactions. I make up reaction was about a week before her mouth for gout. She's not change her diet no other change in soap or detergent. She's been using some moisturizer at home otherwise no other topicals. She's been avoiding makeup. No tongue or mouth swelling no difficulty breathing  Request Mammogram  Review Of Systems:  GEN- denies fatigue, fever, weight loss,weakness, recent illness HEENT- denies eye drainage, change in vision, nasal discharge, CVS- denies chest pain, palpitations RESP- denies SOB, cough, wheeze ABD- denies N/V, change in stools, abd pain Neuro- denies headache, dizziness, syncope, seizure activity       Objective:    BP 128/70 mmHg  Pulse 72  Temp(Src) 98.2 F (36.8 C) (Oral)  Resp 14  Ht 5\' 1"  (1.549 m)  Wt 226 lb (102.513 kg)  BMI 42.72 kg/m2 GEN- NAD, alert and oriented x3 HEENT- PERRL, EOMI, non injected sclera, pink conjunctiva, MMM, oropharynx clear Neck- Supple, no LAD  Skin- erythema with peeling/scaling skin perioral, no pustules no vesciles, erythematous scaley patch on left forearm 2 small satelite patches just below ,+ excoriations         Assessment & Plan:      Problem List Items Addressed This Visit    None    Visit Diagnoses    Allergic dermatitis    -  Primary    Depo Medrol given in office, Hydrocortisone to face with moisturizer sunblock, TAC to arm    Relevant Medications    methylPREDNISolone acetate (DEPO-MEDROL) injection 40 mg (Completed)    Breast cancer screening        Relevant  Orders    MM DIGITAL SCREENING BILATERAL       Note: This dictation was prepared with Dragon dictation along with smaller phrase technology. Any transcriptional errors that result from this process are unintentional.

## 2015-07-06 NOTE — Patient Instructions (Addendum)
Mild moisturizer Dove soap Use steriod twice a day  Steroid shot given F/U Schedule a physical

## 2015-07-09 MED ORDER — METHYLPREDNISOLONE ACETATE 40 MG/ML IJ SUSP
40.0000 mg | Freq: Once | INTRAMUSCULAR | Status: AC
  Administered 2015-07-06: 40 mg via INTRAMUSCULAR

## 2015-07-09 NOTE — Addendum Note (Signed)
Addended by: Phillips OdorSIX, CHRISTINA H on: 07/09/2015 09:06 AM   Modules accepted: Orders

## 2015-07-13 ENCOUNTER — Other Ambulatory Visit: Payer: 59

## 2015-07-13 ENCOUNTER — Ambulatory Visit (HOSPITAL_COMMUNITY)
Admission: RE | Admit: 2015-07-13 | Discharge: 2015-07-13 | Disposition: A | Discharge status: Home / Self Care | Payer: 59 | Source: Ambulatory Visit | Attending: Family Medicine | Admitting: Family Medicine

## 2015-07-13 DIAGNOSIS — Z0000 Encounter for general adult medical examination without abnormal findings: Secondary | ICD-10-CM

## 2015-07-13 DIAGNOSIS — E669 Obesity, unspecified: Secondary | ICD-10-CM

## 2015-07-13 DIAGNOSIS — K219 Gastro-esophageal reflux disease without esophagitis: Secondary | ICD-10-CM

## 2015-07-13 DIAGNOSIS — Z1231 Encounter for screening mammogram for malignant neoplasm of breast: Secondary | ICD-10-CM | POA: Insufficient documentation

## 2015-07-13 DIAGNOSIS — E559 Vitamin D deficiency, unspecified: Secondary | ICD-10-CM

## 2015-07-13 LAB — COMPLETE METABOLIC PANEL WITH GFR
ALT: 13 U/L (ref 6–29)
AST: 14 U/L (ref 10–35)
Albumin: 3.8 g/dL (ref 3.6–5.1)
Alkaline Phosphatase: 87 U/L (ref 33–130)
BUN: 20 mg/dL (ref 7–25)
CHLORIDE: 103 mmol/L (ref 98–110)
CO2: 26 mmol/L (ref 20–31)
Calcium: 8.9 mg/dL (ref 8.6–10.4)
Creat: 0.82 mg/dL (ref 0.50–1.05)
GFR, EST NON AFRICAN AMERICAN: 83 mL/min (ref >=60)
Glucose, Bld: 85 mg/dL (ref 70–99)
Potassium: 4.4 mmol/L (ref 3.5–5.3)
Sodium: 141 mmol/L (ref 135–146)
Total Bilirubin: 0.4 mg/dL (ref 0.2–1.2)
Total Protein: 7.1 g/dL (ref 6.1–8.1)

## 2015-07-13 LAB — CBC WITH DIFFERENTIAL/PLATELET
BASOS ABS: 0 {cells}/uL (ref 0–200)
Basophils Relative: 0 %
EOS ABS: 160 {cells}/uL (ref 15–500)
Eosinophils Relative: 2 %
HEMATOCRIT: 39.8 % (ref 35.0–45.0)
HEMOGLOBIN: 13.2 g/dL (ref 12.0–15.0)
LYMPHS ABS: 2400 {cells}/uL (ref 850–3900)
Lymphocytes Relative: 30 %
MCH: 28.6 pg (ref 27.0–33.0)
MCHC: 33.2 g/dL (ref 32.0–36.0)
MCV: 86.3 fL (ref 80.0–100.0)
MPV: 9.2 fL (ref 7.5–12.5)
Monocytes Absolute: 560 cells/uL (ref 200–950)
Monocytes Relative: 7 %
Neutro Abs: 4880 cells/uL (ref 1500–7800)
Neutrophils Relative %: 61 %
Platelets: 372 10*3/uL (ref 140–400)
RBC: 4.61 MIL/uL (ref 3.80–5.10)
RDW: 14.8 % (ref 11.0–15.0)
WBC: 8 10*3/uL (ref 3.8–10.8)

## 2015-07-13 LAB — LIPID PANEL
CHOL/HDL RATIO: 2.3 ratio (ref <=5.0)
Cholesterol: 200 mg/dL (ref 125–200)
HDL: 87 mg/dL (ref >=46)
LDL CALC: 104 mg/dL (ref <130)
Triglycerides: 45 mg/dL (ref <150)
VLDL: 9 mg/dL (ref <30)

## 2015-07-13 LAB — TSH: TSH: 3.88 m[IU]/L

## 2015-07-14 LAB — VITAMIN D 25 HYDROXY (VIT D DEFICIENCY, FRACTURES): Vit D, 25-Hydroxy: 20 ng/mL — ABNORMAL LOW (ref 30–100)

## 2015-07-16 ENCOUNTER — Telehealth: Payer: Self-pay | Admitting: Family Medicine

## 2015-07-16 ENCOUNTER — Encounter: Payer: Self-pay | Admitting: Family Medicine

## 2015-07-16 ENCOUNTER — Ambulatory Visit (INDEPENDENT_AMBULATORY_CARE_PROVIDER_SITE_OTHER): Payer: 59 | Admitting: Family Medicine

## 2015-07-16 VITALS — BP 118/70 | HR 76 | Temp 98.6°F | Resp 12 | Ht 61.0 in | Wt 227.0 lb

## 2015-07-16 DIAGNOSIS — E559 Vitamin D deficiency, unspecified: Secondary | ICD-10-CM

## 2015-07-16 DIAGNOSIS — Z124 Encounter for screening for malignant neoplasm of cervix: Secondary | ICD-10-CM | POA: Diagnosis not present

## 2015-07-16 DIAGNOSIS — Z0000 Encounter for general adult medical examination without abnormal findings: Secondary | ICD-10-CM | POA: Diagnosis not present

## 2015-07-16 MED ORDER — VITAMIN D (ERGOCALCIFEROL) 1.25 MG (50000 UNIT) PO CAPS: 50000.0000 [IU] | ORAL_CAPSULE | ORAL | Status: DC

## 2015-07-16 MED ORDER — NALTREXONE-BUPROPION HCL ER 8-90 MG PO TB12: ORAL_TABLET | ORAL | Status: DC

## 2015-07-16 NOTE — Telephone Encounter (Signed)
Prescription sent to pharmacy.

## 2015-07-16 NOTE — Progress Notes (Signed)
Patient ID: Emily Aguirre, female   DOB: [birth year], 55 y.o.   MRN: [id number]    Subjective:    Patient ID: Emily Aguirre, female    DOB: [birth year], 55 y.o.   MRN: [id number]  Patient presents for CPE Patient here for complete please physical exam. Her main concern is her weight. She did not lose any significant weight with phentermine. She would like to discuss alternative weight loss medication.   Due for PAP Smear  Immunizations up-to-date Mammogram Done Friday  Colonoscopy up-to-date  Fasting labs reviewed her cholesterol was improved vitamin D was low needs replacement  Review Of Systems:  GEN- denies fatigue, fever, weight loss,weakness, recent illness HEENT- denies eye drainage, change in vision, nasal discharge, CVS- denies chest pain, palpitations RESP- denies SOB, cough, wheeze ABD- denies N/V, change in stools, abd pain GU- denies dysuria, hematuria, dribbling, incontinence MSK- denies joint pain, muscle aches, injury Neuro- denies headache, dizziness, syncope, seizure activity       Objective:    BP 118/70 mmHg  Pulse 76  Temp(Src) 98.6 F (37 C) (Oral)  Resp 12  Ht 5\' 1"  (1.549 m)  Wt 227 lb (102.967 kg)  BMI 42.91 kg/m2  LMP 11/16/2014 GEN- NAD, alert and oriented x3 HEENT- PERRL, EOMI, non injected sclera, pink conjunctiva, MMM, oropharynx clear Neck- Supple, no thyromegaly CVS- RRR, no murmur RESP-CTAB ABD-NABS,soft,NT,ND GU- normal external genitalia, vaginal mucosa pink and moist, cervix visualized no growth, no blood form os, no discharge, no CMT, no ovarian masses, uterus normal size, bartholins cyst on right labia (chronic -does not want removed)  EXT- No edema Pulses- Radial, DP- 2+        Assessment & Plan:      Problem List Items Addressed This Visit    Morbid obesity (HCC) - Primary    Trial of Contrave, discussed medication and side effects Discussed diet and exercise      Relevant Medications   Naltrexone-Bupropion HCl  ER 8-90 MG TB12    Other Visit Diagnoses    Routine general medical examination at a health care facility        F/U mammogram results, reviewed labs, immunizations UTD    Vitamin D deficiency        Replace 50,000IU x12 weeks    Cervical cancer screening        Relevant Orders    PAP, ThinPrep ASCUS Rflx HPV Rflx Type       Note: This dictation was prepared with Dragon dictation along with smaller phrase technology. Any transcriptional errors that result from this process are unintentional.

## 2015-07-16 NOTE — Patient Instructions (Addendum)
Take vitamin D as prescribed, after 3 months take Vitamin D 1000Iu I recommend eye visit once a year I recommend dental visit every 6 months Goal is to  Exercise 30 minutes 5 days a week Schedule your mammogram Contrave for weight loss- start with 1 pill for 1 week, then 1 pill in morning and  1 pill in evening for 1 week, then 1 pill in morning and 2 pills in evening for 1 week, then 2 pills twice a day  F/U 3 months

## 2015-07-16 NOTE — Telephone Encounter (Signed)
Patient would like us to call the contrave [facility] [address] she went home and printed out another coupon since the medication was still $265 with the card you gave her.   CB# [phone]

## 2015-07-16 NOTE — Assessment & Plan Note (Signed)
Trial of Contrave, discussed medication and side effects Discussed diet and exercise

## 2015-07-18 LAB — PAP THINPREP ASCUS RFLX HPV RFLX TYPE

## 2015-07-19 ENCOUNTER — Encounter: Payer: Self-pay | Admitting: *Deleted

## 2015-07-31 ENCOUNTER — Telehealth: Payer: Self-pay | Admitting: *Deleted

## 2015-07-31 NOTE — Telephone Encounter (Signed)
Received fax from pharmacy requesting clarification on starter pack of Contrave.   VO given as follows: (1) tab PO QD x1 week, then (1) tab PO BID x1 week, then (2) tabs PO QAM and (1) tab PO Q PM x1 week, then (2) tabs PO BID as maintenance dose.

## 2015-07-31 NOTE — Telephone Encounter (Signed)
noted 

## 2015-10-12 ENCOUNTER — Encounter: Payer: Self-pay | Admitting: Family Medicine

## 2015-10-12 ENCOUNTER — Ambulatory Visit (INDEPENDENT_AMBULATORY_CARE_PROVIDER_SITE_OTHER): Payer: 59 | Admitting: Family Medicine

## 2015-10-12 ENCOUNTER — Ambulatory Visit (HOSPITAL_COMMUNITY)
Admission: RE | Admit: 2015-10-12 | Discharge: 2015-10-12 | Disposition: A | Discharge status: Home / Self Care | Payer: 59 | Source: Ambulatory Visit | Attending: Family Medicine | Admitting: Family Medicine

## 2015-10-12 VITALS — BP 130/76 | HR 88 | Temp 98.9°F | Resp 14 | Ht 61.0 in | Wt 227.0 lb

## 2015-10-12 DIAGNOSIS — M25552 Pain in left hip: Secondary | ICD-10-CM

## 2015-10-12 DIAGNOSIS — M47896 Other spondylosis, lumbar region: Secondary | ICD-10-CM | POA: Diagnosis not present

## 2015-10-12 MED ORDER — HYDROCODONE-ACETAMINOPHEN 5-325 MG PO TABS: 1.0000 | ORAL_TABLET | Freq: Four times a day (QID) | ORAL | 0 refills | Status: DC | PRN

## 2015-10-12 MED ORDER — DICLOFENAC SODIUM 75 MG PO TBEC: 75.0000 mg | DELAYED_RELEASE_TABLET | Freq: Two times a day (BID) | ORAL | 0 refills | Status: DC

## 2015-10-12 NOTE — Assessment & Plan Note (Signed)
She had little weight loss and SE with stent main and now contrary. Discussed her nutrition, he will contact her insurance company to get in with their nutrition wellness program

## 2015-10-12 NOTE — Progress Notes (Signed)
   Subjective:    Patient ID: Emily Aguirre, female    DOB: 1960/03/07, 55 y.o.   MRN: [id number]  Patient presents for S/P Fall (x1 month- reports that she fell while playing with dog, now has pain in L hip while sitting for extended periods or walking) and Medication Management (reports SE with Contrave- nausea, dizzines- has stopped taking)  Had severe Nausea/dizziness with Contrave unable to take. Weight unchanged     Left hip pain for past month " feels swollen", was kicking ball with her dog, afterwards had pain in groin an hip, has used bengay, heat/ice, taken a few doses of Aleve which have not helped much. Denies any back pain, but pain does radiate down thigh and tingles at times, no change in bowel or bladder Worse after sitting for long period of time, which she does on her job, she feels stiff when she gets up   Unable to sleep due to pain  Review Of Systems:  GEN- denies fatigue, fever, weight loss,weakness, recent illness HEENT- denies eye drainage, change in vision, nasal discharge, CVS- denies chest pain, palpitations RESP- denies SOB, cough, wheeze ABD- denies N/V, change in stools, abd pain GU- denies dysuria, hematuria, dribbling, incontinence MSK- + joint pain, muscle aches, injury Neuro- denies headache, dizziness, syncope, seizure activity       Objective:    BP 130/76 (BP Location: Left Arm, Patient Position: Sitting, Cuff Size: Large)   Pulse 88   Temp 98.9 F (37.2 C) (Oral)   Resp 14   Ht 5\' 1"  (1.549 m)   Wt 227 lb (103 kg)   LMP 11/16/2014 Comment: irregular  BMI 42.89 kg/m  GEN- NAD, alert and oriented x3 CVS- RRR, no murmur RESP-CTAB MSK- Lumbar spine NT, mild TTP left paraspinal, neg SLR, pain with IR/ER left hip, TTP in left groin and over greater trochanter, fair ROM bilat knees, no effusion, antalgic gait  From seated position  EXT- No edema Pulses- Radial, DP- 2+        Assessment & Plan:      Problem List Items Addressed This  Visit    Morbid obesity (HCC)    She had little weight loss and SE with stent main and now contrary. Discussed her nutrition, he will contact her insurance company to get in with their nutrition wellness program       Other Visit Diagnoses    Hip pain, acute, left    -  Primary   concen for groin strain based on mechanism, doubt dislocation as she is still able to ambulate, bursitis also possibility. Start diclofenac obtain Xray, norco for bedtime   Relevant Orders   DG HIP UNILAT W OR W/O PELVIS 2-3 VIEWS LEFT      Note: This dictation was prepared with Dragon dictation along with smaller phrase technology. Any transcriptional errors that result from this process are unintentional.

## 2015-10-12 NOTE — Patient Instructions (Addendum)
Nutrition referral Get xray of hip Take Anti-inflammatory twice a day with food Take pain medication F/u pending results

## 2015-10-19 ENCOUNTER — Ambulatory Visit: Payer: 59 | Admitting: Family Medicine

## 2015-11-09 ENCOUNTER — Encounter: Payer: Self-pay | Admitting: Family Medicine

## 2015-11-09 ENCOUNTER — Ambulatory Visit (INDEPENDENT_AMBULATORY_CARE_PROVIDER_SITE_OTHER): Payer: 59 | Admitting: Family Medicine

## 2015-11-09 VITALS — BP 128/78 | HR 82 | Temp 98.0°F | Resp 14 | Ht 61.0 in | Wt 230.0 lb

## 2015-11-09 DIAGNOSIS — M47816 Spondylosis without myelopathy or radiculopathy, lumbar region: Secondary | ICD-10-CM

## 2015-11-09 DIAGNOSIS — M549 Dorsalgia, unspecified: Secondary | ICD-10-CM

## 2015-11-09 DIAGNOSIS — M4696 Unspecified inflammatory spondylopathy, lumbar region: Secondary | ICD-10-CM | POA: Diagnosis not present

## 2015-11-09 DIAGNOSIS — M5430 Sciatica, unspecified side: Secondary | ICD-10-CM | POA: Diagnosis not present

## 2015-11-09 MED ORDER — GABAPENTIN 100 MG PO CAPS: ORAL_CAPSULE | ORAL | 3 refills | Status: DC

## 2015-11-09 MED ORDER — OXYCODONE-ACETAMINOPHEN 5-325 MG PO TABS: 1.0000 | ORAL_TABLET | Freq: Three times a day (TID) | ORAL | 0 refills | Status: DC | PRN

## 2015-11-09 NOTE — Patient Instructions (Signed)
For your arthritis referral to orthopedics  For gabapentin for nerve pain take 1 capsule at bedtime for a few days , then okay to increase to 2 capsules  Percocet for severe pain F/U as needed

## 2015-11-09 NOTE — Progress Notes (Signed)
   Subjective:    Patient ID: Emily BarrowAlberta Y Busser, female    DOB: 07/17/1960, 55 y.o.   MRN: [id number]  Patient presents for Leg Pain (L leg pain- reports that she has L legand lower back pain)   Pt  here with ongoing leg pain. She was seen about a month ago for hip pain. X-ray of the hip did not show any acute abnormality but she did have facet arthritis of the lumbar spine. Now she is getting more low back pain that is radiating down her left side. She has pain when she sits or stands long. To time. I gave her diclofenac as well as hydrocodone the hydrocodone did not help with the pain. She would like to try gabapentin for the nerve pain as her sister has been on this for her back and it helped. She denies any change in bowel bladder occasionally has some tingling in her feet but nothing of concern.    Review Of Systems:  GEN- denies fatigue, fever, weight loss,weakness, recent illness HEENT- denies eye drainage, change in vision, nasal discharge, CVS- denies chest pain, palpitations RESP- denies SOB, cough, wheeze ABD- denies N/V, change in stools, abd pain GU- denies dysuria, hematuria, dribbling, incontinence MSK- + joint pain, muscle aches, injury Neuro- denies headache, dizziness, syncope, seizure activity       Objective:    BP 128/78 (BP Location: Left Arm, Patient Position: Sitting, Cuff Size: Large)   Pulse 82   Temp 98 F (36.7 C) (Oral)   Resp 14   Ht 5\' 1"  (1.549 m)   Wt 230 lb (104.3 kg)   LMP 11/16/2014 Comment: irregular  SpO2 98%   BMI 43.46 kg/m  GEN- NAD, alert and oriented x3 MSK- Spine NT, mild TTP Left paraspinals, equivical SLR, fair ROM spine, Hips, knees Neuro- normal tone LE, strength decreased left compared to right, non antalgic gait sensation grossly in tact LE Ext- no edema         Assessment & Plan:      Problem List Items Addressed This Visit    None    Visit Diagnoses    Facet arthritis of lumbar region [facility](HCC)    -  Primary   Facet  arthritis,wosrening symptoms with some sciatica like pain, trial of gabapentin, given Percocet #20 tabs, diclofenac with food. Refer to spine ortho prefer to get all imaging in same place if possible. No red flags on exam today Gabapentin start 100mg , titrate to 200mg  Note pt also brought an inversion table last her to be very careful she wants to proceed with using this table as this could worsen her back    Relevant Medications   oxyCODONE-acetaminophen (ROXICET) 5-325 MG tablet   Back pain with sciatica       Relevant Medications   gabapentin (NEURONTIN) 100 MG capsule   oxyCODONE-acetaminophen (ROXICET) 5-325 MG tablet      Note: This dictation was prepared with Dragon dictation along with smaller phrase technology. Any transcriptional errors that result from this process are unintentional.

## 2015-11-23 ENCOUNTER — Ambulatory Visit (INDEPENDENT_AMBULATORY_CARE_PROVIDER_SITE_OTHER): Payer: 59 | Admitting: Sports Medicine

## 2015-11-23 ENCOUNTER — Encounter (INDEPENDENT_AMBULATORY_CARE_PROVIDER_SITE_OTHER): Payer: Self-pay | Admitting: Sports Medicine

## 2015-11-23 ENCOUNTER — Ambulatory Visit (INDEPENDENT_AMBULATORY_CARE_PROVIDER_SITE_OTHER): Payer: 59

## 2015-11-23 VITALS — BP 167/93 | HR 85 | Ht 61.0 in | Wt 230.0 lb

## 2015-11-23 DIAGNOSIS — M5417 Radiculopathy, lumbosacral region: Secondary | ICD-10-CM | POA: Diagnosis not present

## 2015-11-23 DIAGNOSIS — M5416 Radiculopathy, lumbar region: Secondary | ICD-10-CM

## 2015-11-23 DIAGNOSIS — M4306 Spondylolysis, lumbar region: Secondary | ICD-10-CM | POA: Diagnosis not present

## 2015-11-23 MED ORDER — GABAPENTIN 300 MG PO CAPS
ORAL_CAPSULE | ORAL | 1 refills | Status: DC
Start: 2015-11-23 — End: 2016-05-21

## 2015-11-23 NOTE — Progress Notes (Addendum)
Emily Aguirre - 55 y.o. female MRN [id number]  Date of birth: [birth year]  Office Visit Note: Visit Date: 11/23/2015 PCP: Milinda AntisURHAM, KAWANTA, MD Referred by: Salley Scarleturham, Kawanta F, MD  Subjective: Chief Complaint  Patient presents with  . Lower Back - Pain  . Left Leg - Pain   HPI: Patient states low back pain for about 2-3 months radiating into left leg.  Has a little numbness and tingling.  Hurts to sit.   Pain in his been present across the anterior aspect of the thigh. No known injury. Some difficulty with walking & improved with use of a shopping cart. She denies any fevers chills recently gain or weight loss. No night sweats. Some nighttime disturbances due to this discomfort however. No changes in bowel or bladder.    ROS Otherwise per HPI.  Assessment & Plan: Visit Diagnoses:  1. Lumbar back pain with radiculopathy affecting right lower extremity   2. Spondylolysis, lumbar region     Plan: Findings:  Symptoms are consistent with an L4 radiculitis on the left. MRI of lumbar spine is indicated given she has tried appropriate conservative management including physician guided exercises & medications & degenerative changes are noted on lumbar spine. Will plan follow-up with her after the MRI is obtained to discuss options including potential injection therapy. We'll titrate her gabapentin as below.    Meds & Orders:  Meds ordered this encounter  Medications  . gabapentin (NEURONTIN) 300 MG capsule    Sig: Start with 1 tab po qhs X 1 week, then increase to 1 tab po bid X 1 week then 1 tab po prn    Dispense:  90 capsule    Refill:  1    Orders Placed This Encounter  Procedures  . XR Lumbar Spine 2-3 Views  . MR Lumbar Spine w/o contrast    Follow-up: Return for MRI review.   Procedures: No procedures performed  No notes on file   Clinical History: No specialty comments available.  She reports that she has never smoked. She has never used smokeless tobacco. No results  for input(s): HGBA1C, LABURIC in the last 8760 hours.  Objective:  VS:  HT:5\' 1"  (154.9 cm)   WT:230 lb (104.3 kg)  BMI:43.5    BP:(!) 167/93  HR:85bpm  TEMP: ( )  RESP:  Physical Exam  Constitutional:  Adult obese female. No acute distress. Alert & appropriately interactive. Bilateral lower extremities overall well aligned. She has good internal/external rotation of bilateral hips full flexion extension of bilateral knees. She is pain straight leg raise on the left. Minimal tenderness with palpation of the popliteal space on the left & pain with greater sciatic notch palpation on the left. Lower extremity strength is otherwise 5/5 with no focal weakness. Sensation is intact to light touch in bilateral lower extremities.    Ortho Exam Imaging: Xr Lumbar Spine 2-3 Views  Result Date: 11/23/2015 2V lumbar spine: Anteriolisthesis of L4 on L5. Loss of disc space between L4-L5 as well as L5-S1. Impression degenerative arthropathy with anterior listhesis of L4 on L5.   Past Medical/Family/Surgical/Social History: Medications & Allergies reviewed per EMR Patient Active Problem List   Diagnosis Date Noted  . Morbid obesity (HCC) 01/11/2014  . FH: colon cancer 04/22/2013  . GERD (gastroesophageal reflux disease)    Past Medical History:  Diagnosis Date  . Arthritis    bil knees  . GERD (gastroesophageal reflux disease)   . H/O abnormal Pap smear   . H/O  hematuria    Family History  Problem Relation Age of Onset  . Colon cancer Other     paternal great uncle  . Hypertension Mother    Past Surgical History:  Procedure Laterality Date  . COLONOSCOPY N/A 05/20/2013   Procedure: COLONOSCOPY;  Surgeon: West BaliSandi L Fields, MD;  Location: [facility];  Service: Endoscopy;  Laterality: N/A;  12:15-rescheduled to 8:30 Soledad GerlachLeigh Ann to notify pt  . DILATION AND CURETTAGE OF UTERUS  2001   D&E  . DILATION AND CURETTAGE OF UTERUS     x 2  due to misscarriages  . HYSTEROSCOPY W/D&C  09/05/2010    Procedure: DILATATION AND CURETTAGE (D&C) /HYSTEROSCOPY;  Surgeon: Robley FriesVaishali R Mody;  Location: [facility];  Service: Gynecology;  Laterality: Bilateral;  Dilataion & Currettage With Hysteroscopy; Polypectomy  . IUD REMOVAL  09/05/2010   Procedure: INTRAUTERINE DEVICE (IUD) REMOVAL;  Surgeon: Robley FriesVaishali R Mody;  Location: [facility];  Service: Gynecology;  Laterality: N/A;  Mirena IUD insertion.  Marland Kitchen. LAPAROSCOPIC OVARIAN CYSTECTOMY  2000   Social History   Occupational History  .  [employer]   Social History Main Topics  . Smoking status: Never Smoker  . Smokeless tobacco: Never Used  . Alcohol use No  . Drug use: No  . Sexual activity: Yes    Birth control/ protection: Condom

## 2015-11-23 NOTE — Patient Instructions (Signed)
We are ordering an MRI for you today.  The imaging office will be calling you to schedule your appointment after we obtain authorization from your insurance company.  Immediately after you schedule your appointment with them please call our office back ([phone]) to schedule a follow-up appointment with me.  This will need to be at least 24 hours after you have the test done to give radiologist and myself time to review the test.  We will discuss further treatment options at your follow up appointment.   

## 2015-12-09 ENCOUNTER — Ambulatory Visit
Admission: RE | Admit: 2015-12-09 | Discharge: 2015-12-09 | Disposition: A | Discharge status: Home / Self Care | Payer: 59 | Source: Ambulatory Visit | Attending: Sports Medicine | Admitting: Sports Medicine

## 2015-12-09 DIAGNOSIS — M5416 Radiculopathy, lumbar region: Secondary | ICD-10-CM

## 2015-12-09 DIAGNOSIS — M4306 Spondylolysis, lumbar region: Secondary | ICD-10-CM

## 2015-12-14 ENCOUNTER — Ambulatory Visit (INDEPENDENT_AMBULATORY_CARE_PROVIDER_SITE_OTHER): Payer: 59 | Admitting: Sports Medicine

## 2015-12-17 ENCOUNTER — Ambulatory Visit (INDEPENDENT_AMBULATORY_CARE_PROVIDER_SITE_OTHER): Payer: 59 | Admitting: Sports Medicine

## 2015-12-17 ENCOUNTER — Encounter (INDEPENDENT_AMBULATORY_CARE_PROVIDER_SITE_OTHER): Payer: Self-pay | Admitting: Sports Medicine

## 2015-12-17 VITALS — BP 196/98 | HR 90 | Ht 61.0 in | Wt 220.0 lb

## 2015-12-17 DIAGNOSIS — N281 Cyst of kidney, acquired: Secondary | ICD-10-CM | POA: Diagnosis not present

## 2015-12-17 DIAGNOSIS — R030 Elevated blood-pressure reading, without diagnosis of hypertension: Secondary | ICD-10-CM

## 2015-12-17 DIAGNOSIS — M5416 Radiculopathy, lumbar region: Secondary | ICD-10-CM

## 2015-12-17 DIAGNOSIS — M4306 Spondylolysis, lumbar region: Secondary | ICD-10-CM

## 2015-12-17 DIAGNOSIS — M5417 Radiculopathy, lumbosacral region: Secondary | ICD-10-CM

## 2015-12-17 DIAGNOSIS — I10 Essential (primary) hypertension: Secondary | ICD-10-CM | POA: Insufficient documentation

## 2015-12-17 NOTE — Progress Notes (Signed)
Derl Barrowlberta Y Marlette - 55 y.o. female MRN [id number]  Date of birth: Oct 25, 1960  Office Visit Note: Visit Date: 12/17/2015 PCP: Milinda AntisURHAM, KAWANTA, MD Referred by: Salley Scarleturham, Kawanta F, MD  Subjective: Chief Complaint  Patient presents with  . Lower Back - Follow-up, Pain    Here to review MRI Lumbar.   HPI: Patient here for follow-up after office visit 2 weeks ago. She's been performing therapeutic exercises as well as taking gabapentin 300mg  daily at bedtime. She reports almost complete resolution of her symptoms. She is not having any nighttime awakenings. She's not noticed any significant weakness. MRI is reviewed as below. ROS: She denies any changes in urinary habits.. Otherwise per HPI.   Clinical History: No specialty comments available.  She reports that she has never smoked. She has never used smokeless tobacco.  No results for input(s): HGBA1C, LABURIC in the last 8760 hours.  Assessment & Plan: Visit Diagnoses:    ICD-9-CM ICD-10-CM   1. Lumbar back pain with radiculopathy affecting right lower extremity 724.4 M54.17   2. Spondylolysis, lumbar region 738.4 M43.06   3. Elevated blood pressure reading in office without diagnosis of hypertension 796.2 R03.0   4. Renal cyst 753.10 N28.1 US Renal    Plan: Overall her back & radicular leg pain has significantly improved with titration of her gabapentin & modifications of therapeutic exercises. She does have significant low back degenerative changes most notably at L5-S1 overall says it is symmetric bulge at L3/4 notably was previously causing the majority of her symptoms at last visit as I suspect this may be subacute. Given her well she is doing from a symptom standpoint we will defer any further treatment for her low back but if any acute flareup she can call to get scheduled with Dr. Alvester MorinNewton for epidural steroid injection.  Additionally she has elevated blood pressure & I recommend she follow up with her primary care physician to  discuss further evaluation & she has never had any issues with blood pressure in the past. Did recommend trying to avoid taking NSAIDs until she is further evaluated. Initial reading with automated blood pressure cuff was 196/98, recheck was still elevated at 156/98  Incidental renal cyst that was previously seen on CT scan has approximately doubled in size. We'll plan to get her scheduled for bilateral renal ultrasound for further characterization of these masses. Will plan to call her with these results as long as they're normal.  Follow-up: Return if symptoms worsen or fail to improve.  Meds: No orders of the defined types were placed in this encounter.  Procedures: No notes on file   Objective:  VS:  HT:5\' 1"  (154.9 cm)   WT:220 lb (99.8 kg)  BMI:41.7    BP:(!) 196/98  HR:90bpm  TEMP: ( )  RESP:   BP Readings from Last 3 Encounters:  12/17/15 (!) 196/98  11/23/15 (!) 167/93  11/09/15 128/78   Physical Exam: Adult female. Alert and appropriate.  In no acute distress.  Lower extremities are overall well aligned with no significant deformity. No significant swelling. DP & PT pulses 2+/4. No significant bruising/ecchymosis or erythema the skin BACK: Bilateral negative straight leg raise. Sensation intact to light touch in bilateral lower extremities No significant midline tenderness. Good internal and external rotation of the hips. Patient is able to heel and toe walk without significant difficulty Manual muscle testing is 5+/5 without focality  Imaging: Mr Lumbar Spine W/o Contrast  Result Date: 12/09/2015 CLINICAL DATA:  Low back pain  and left leg pain for the past 2 months. EXAM: MRI LUMBAR SPINE WITHOUT CONTRAST TECHNIQUE: Multiplanar, multisequence MR imaging of the lumbar spine was performed. No intravenous contrast was administered. COMPARISON:  Multiple exams, including 11/23/2015 and 12/08/2008 FINDINGS: Segmentation: The lowest lumbar type non-rib-bearing vertebra is  labeled as L5. Alignment:  3 mm grade 1 degenerative anterolisthesis at L4-5. Vertebrae: 2.7 by 2.4 by 2.3 cm hemangioma eccentric to the right in the L4 vertebral body. Type 2 degenerative endplate findings at L5-S1 with disc desiccation, loss of disc height, and endplate irregularity. Several smaller hemangiomas are present in the lower thoracic spine and upper lumbar spine. Mild facet and perifacet edema bilaterally at the L4-5 level associated with extensive degenerative facet arthropathy. Conus medullaris: Extends to the L1 level and appears normal. Paraspinal and other soft tissues: Fluid signal intensity 1.9 cm lesion of the right kidney lower pole. Left kidney lower pole is not seen, but there is an at least 7.6 cm cystic lesion in the vicinity of the left kidney lower pole, back on 12/08/2008 a cyst exophytic from the left kidney upper lower pole measured 3.9 cm. Disc levels: L1-2:  No impingement.  Mild disc bulge. L2-3: Borderline left subarticular lateral recess stenosis due to disc bulge and facet arthropathy. L3-4: Borderline bilateral foraminal stenosis due to disc bulge and facet arthropathy. L4-5: Borderline bilateral foraminal stenosis due to disc bulge and left greater than right facet arthropathy. L5-S1: Mild right foraminal stenosis and mild right subarticular lateral recess stenosis due to disc bulge, right paracentral on lateral recess disc protrusion, and facet arthropathy. IMPRESSION: 1. Lumbar spondylosis and degenerative disc disease, causing mild impingement at L5-S1 eccentric to the right, and borderline impingement at L2- 3, L3-4, and L4-5, as detailed above. 2. Vertebral hemangiomatosis, largest hemangioma in the L4 vertebral body. 3. Enlargement of the left kidney lower pole cyst, only partially imaged on today's exam. 4. Degenerative facet and perifacet edema at L4-5 due to facet arthropathy. Electronically Signed   By: Gaylyn Rong M.D.   On: 12/09/2015 13:49   Xr Lumbar  Spine 2-3 Views  Result Date: 11/23/2015 2V lumbar spine: Anteriolisthesis of L4 on L5. Loss of disc space between L4-L5 as well as L5-S1. Impression degenerative arthropathy with anterior listhesis of L4 on L5.   Past Medical/Family/Surgical/Social History: Medications & Allergies reviewed per EMR Patient Active Problem List   Diagnosis Date Noted  . Elevated blood pressure reading in office without diagnosis of hypertension 12/17/2015  . Renal cyst 12/17/2015  . Morbid obesity (HCC) 01/11/2014  . FH: colon cancer 04/22/2013  . GERD (gastroesophageal reflux disease)    Past Medical History:  Diagnosis Date  . Arthritis    bil knees  . GERD (gastroesophageal reflux disease)   . H/O abnormal Pap smear   . H/O hematuria    Family History  Problem Relation Age of Onset  . Colon cancer Other     paternal great uncle  . Hypertension Mother    Past Surgical History:  Procedure Laterality Date  . COLONOSCOPY N/A 05/20/2013   Procedure: COLONOSCOPY;  Surgeon: West Bali, MD;  Location: [facility];  Service: Endoscopy;  Laterality: N/A;  12:15-rescheduled to 8:30 Soledad Gerlach to notify pt  . DILATION AND CURETTAGE OF UTERUS  2001   D&E  . DILATION AND CURETTAGE OF UTERUS     x 2  due to misscarriages  . HYSTEROSCOPY W/D&C  09/05/2010   Procedure: DILATATION AND CURETTAGE (D&C) /HYSTEROSCOPY;  Surgeon: Robley FriesVaishali R Mody;  Location: [facility];  Service: Gynecology;  Laterality: Bilateral;  Dilataion & Currettage With Hysteroscopy; Polypectomy  . IUD REMOVAL  09/05/2010   Procedure: INTRAUTERINE DEVICE (IUD) REMOVAL;  Surgeon: Robley FriesVaishali R Mody;  Location: [facility];  Service: Gynecology;  Laterality: N/A;  Mirena IUD insertion.  Marland Kitchen. LAPAROSCOPIC OVARIAN CYSTECTOMY  2000   Social History   Occupational History  .  [employer]   Social History Main Topics  . Smoking status: Never Smoker  . Smokeless tobacco: Never Used  . Alcohol use No  . Drug use: No  . Sexual activity: Yes    Birth  control/ protection: Condom

## 2015-12-17 NOTE — Patient Instructions (Signed)
We are ordering an A Renal Ultrasound for you today.  The imaging office will be calling you to schedule your appointment after we obtain authorization from your insurance company.  I'll plan to call you with these results.

## 2015-12-20 ENCOUNTER — Telehealth (INDEPENDENT_AMBULATORY_CARE_PROVIDER_SITE_OTHER): Payer: Self-pay | Admitting: *Deleted

## 2015-12-20 NOTE — Telephone Encounter (Signed)
Pt has appt scheduled for US renal Thurs Dec 14 at 11:15am, pt is to have a full bladder 30 mins before her appt. Left message on vm to return my call for appt.

## 2015-12-24 NOTE — Telephone Encounter (Signed)
Called both home and mobile number left messages on vm to return my call for appt informatin

## 2015-12-27 ENCOUNTER — Ambulatory Visit (HOSPITAL_COMMUNITY): Admission: RE | Admit: 2015-12-27 | Payer: 59 | Source: Ambulatory Visit

## 2016-01-18 ENCOUNTER — Ambulatory Visit (HOSPITAL_COMMUNITY)
Admission: RE | Admit: 2016-01-18 | Discharge: 2016-01-18 | Disposition: A | Discharge status: Home / Self Care | Payer: 59 | Source: Ambulatory Visit | Attending: Sports Medicine | Admitting: Sports Medicine

## 2016-01-18 DIAGNOSIS — N281 Cyst of kidney, acquired: Secondary | ICD-10-CM | POA: Diagnosis not present

## 2016-01-21 NOTE — Progress Notes (Signed)
Talked with patient and advised her of message concerning US results.

## 2016-04-04 ENCOUNTER — Encounter: Payer: Self-pay | Admitting: Family Medicine

## 2016-04-04 ENCOUNTER — Ambulatory Visit (INDEPENDENT_AMBULATORY_CARE_PROVIDER_SITE_OTHER): Payer: 59 | Admitting: Family Medicine

## 2016-04-04 VITALS — BP 144/78 | HR 80 | Temp 98.5°F | Resp 14 | Ht 61.0 in | Wt 232.0 lb

## 2016-04-04 DIAGNOSIS — T7840XA Allergy, unspecified, initial encounter: Secondary | ICD-10-CM

## 2016-04-04 DIAGNOSIS — I10 Essential (primary) hypertension: Secondary | ICD-10-CM

## 2016-04-04 DIAGNOSIS — T783XXA Angioneurotic edema, initial encounter: Secondary | ICD-10-CM

## 2016-04-04 MED ORDER — PREDNISONE 10 MG PO TABS: ORAL_TABLET | ORAL | 0 refills | Status: DC

## 2016-04-04 MED ORDER — AMLODIPINE BESYLATE 5 MG PO TABS: 5.0000 mg | ORAL_TABLET | Freq: Every day | ORAL | 3 refills | Status: DC

## 2016-04-04 MED ORDER — METHYLPREDNISOLONE ACETATE 40 MG/ML IJ SUSP
40.0000 mg | Freq: Once | INTRAMUSCULAR | Status: AC
  Administered 2016-04-04: 40 mg via INTRAMUSCULAR

## 2016-04-04 MED ORDER — HYDROCORTISONE 2.5 % EX CREA: TOPICAL_CREAM | Freq: Two times a day (BID) | CUTANEOUS | 0 refills | Status: DC

## 2016-04-04 NOTE — Progress Notes (Signed)
   Subjective:    Patient ID: Emily Aguirre, female    DOB: Oct 19, 1960, 56 y.o.   MRN: [id number]  Patient presents for Rash (intermittent itchy rash to B arms- is fasting)  Pt here with recurrent rash, on face, left arm. This is the 3rd time it has occurred. She was treated last summer for this rash. She has taken benadryl, face very itchy and red and her left forearm. She also had swelling of her eyelies L>R She has figured out she cant eat sunflower oil or she gets itchy. No other allergy to soap or detergent  BP- her bp has been elevated at home and other doctors offices as well    Review Of Systems:  GEN- denies fatigue, fever, weight loss,weakness, recent illness HEENT- denies eye drainage, change in vision, nasal discharge, CVS- denies chest pain, palpitations RESP- denies SOB, cough, wheeze ABD- denies N/V, change in stools, abd pain GU- denies dysuria, hematuria, dribbling, incontinence MSK- denies joint pain, muscle aches, injury Neuro- denies headache, dizziness, syncope, seizure activity       Objective:    BP (!) 144/78   Pulse 80   Temp 98.5 F (36.9 C) (Oral)   Resp 14   Ht 5\' 1"  (1.549 m)   Wt 232 lb (105.2 kg)   LMP 11/16/2014 Comment: irregular  SpO2 98%   BMI 43.84 kg/m  GEN- NAD, alert and oriented x3 HEENT- PERRL, EOMI, non injected sclera, pink conjunctiva, MMM, oropharynx clear,uvula midline Neck- Supple, no LAD CVS- RRR, no murmur RESP-CTAB Skin- erythema and irritation of face, around the mouth,mild swelling left eye lid  excoriationa and erythema of left arm         Assessment & Plan:      Problem List Items Addressed This Visit    Hypertension    Due to her allergic status currently of unknown cause Will not start an ACE or HCTZ Start norvasc 5mg        Relevant Medications   amLODipine (NORVASC) 5 MG tablet   Allergic reaction   Relevant Medications   methylPREDNISolone acetate (DEPO-MEDROL) injection 40 mg (Completed)     Other Visit Diagnoses    Angioedema, initial encounter    -  Primary      Note: This dictation was prepared with Dragon dictation along with smaller phrase technology. Any transcriptional errors that result from this process are unintentional.

## 2016-04-04 NOTE — Patient Instructions (Addendum)
Take the steroids starting tomorrow Use cream on face as needed  Take norvasc for your blood pressure Call with blood pressure readings in 4 weeks  Referral to allergist

## 2016-04-06 ENCOUNTER — Encounter: Payer: Self-pay | Admitting: Family Medicine

## 2016-04-06 NOTE — Assessment & Plan Note (Signed)
Due to her allergic status currently of unknown cause Will not start an ACE or HCTZ Start norvasc 5mg 

## 2016-04-23 ENCOUNTER — Other Ambulatory Visit: Payer: Self-pay | Admitting: Family Medicine

## 2016-04-23 ENCOUNTER — Telehealth: Payer: Self-pay | Admitting: *Deleted

## 2016-04-23 DIAGNOSIS — T7840XA Allergy, unspecified, initial encounter: Secondary | ICD-10-CM

## 2016-04-23 DIAGNOSIS — T783XXA Angioneurotic edema, initial encounter: Secondary | ICD-10-CM

## 2016-04-23 MED ORDER — PREDNISONE 10 MG PO TABS: ORAL_TABLET | ORAL | 0 refills | Status: DC

## 2016-04-23 NOTE — Telephone Encounter (Signed)
Received VM from patient.   Requested refill on prednisone taper for skin irritation. Ok to refill?  Also inquired as to status of allergist referral. No referral orders noted. Ok to place order?

## 2016-04-23 NOTE — Telephone Encounter (Signed)
Patient returned call and made aware.

## 2016-04-23 NOTE — Telephone Encounter (Signed)
Okay to refill Yes, look at my note from 3/23 and use those diagnosis to send allergy referral - I forgot to do it, let her know

## 2016-04-23 NOTE — Telephone Encounter (Signed)
Prescription sent to pharmacy.   Referral orders placed.   Call placed to patient. LMTRC.

## 2016-04-30 DIAGNOSIS — Z719 Counseling, unspecified: Secondary | ICD-10-CM | POA: Diagnosis not present

## 2016-05-07 DIAGNOSIS — Z719 Counseling, unspecified: Secondary | ICD-10-CM | POA: Diagnosis not present

## 2016-05-14 DIAGNOSIS — Z719 Counseling, unspecified: Secondary | ICD-10-CM | POA: Diagnosis not present

## 2016-05-21 ENCOUNTER — Ambulatory Visit (INDEPENDENT_AMBULATORY_CARE_PROVIDER_SITE_OTHER): Payer: 59 | Admitting: Family Medicine

## 2016-05-21 ENCOUNTER — Encounter: Payer: Self-pay | Admitting: Family Medicine

## 2016-05-21 VITALS — BP 142/82 | HR 100 | Temp 98.3°F | Resp 16 | Ht 61.0 in | Wt 230.0 lb

## 2016-05-21 DIAGNOSIS — R21 Rash and other nonspecific skin eruption: Secondary | ICD-10-CM | POA: Diagnosis not present

## 2016-05-21 DIAGNOSIS — T7840XD Allergy, unspecified, subsequent encounter: Secondary | ICD-10-CM

## 2016-05-21 DIAGNOSIS — Z719 Counseling, unspecified: Secondary | ICD-10-CM | POA: Diagnosis not present

## 2016-05-21 MED ORDER — METHYLPREDNISOLONE ACETATE 40 MG/ML IJ SUSP
40.0000 mg | Freq: Once | INTRAMUSCULAR | Status: AC
  Administered 2016-05-21: 40 mg via INTRAMUSCULAR

## 2016-05-21 NOTE — Progress Notes (Signed)
   Subjective:    Patient ID: Emily Aguirre, female    DOB: 12/07/1960, 56 y.o.   MRN: [id number]  Patient presents for Rash (face has broken out and is itching- states that she was eating an apple yesterday)   Pt here with recurrent breakout. She has had at least 4-5 episodes of allergic reactions after eating foods. Typically her face gets red with peeling, she has had angioedema as well. Last 2 times occurred after eating foods with sunflower oil or artifical sweetners. Yesterday was eating an apple and a few minutes after face became red and itchy and began peeling hours later. Took ben daryl last night and Allegra. No difficulty breathing, no swelling of tongue or lips this time. She tried topical hydrocortisone this made her skin burn, so she used coconut oil and witch hazel which helpes sooth her skin  Has appt with allergist on 5/25    Review Of Systems:  GEN- denies fatigue, fever, weight loss,weakness, recent illness HEENT- denies eye drainage, change in vision, nasal discharge, CVS- denies chest pain, palpitations RESP- denies SOB, cough, wheeze ABD- denies N/V, change in stools, abd pain GU- denies dysuria, hematuria, dribbling, incontinence MSK- denies joint pain, muscle aches, injury Neuro- denies headache, dizziness, syncope, seizure activity       Objective:    BP (!) 142/82   Pulse 100   Temp 98.3 F (36.8 C) (Oral)   Resp 16   Ht 5\' 1"  (1.549 m)   Wt 230 lb (104.3 kg)   LMP 11/16/2014 Comment: irregular  SpO2 98%   BMI 43.46 kg/m  GEN- NAD, alert and oriented x3 HEENT- PERRL, EOMI, non injected sclera, pink conjunctiva, MMM, oropharynx clear,uvula midline  Neck- Supple, LAD RESP-normal WOB Skin- erythema on cheeks, chin, forehead with dry peeling skin on bilat cheeks and upper lids, no blisters, NT, erythematous patches behind ears        Assessment & Plan:      Problem List Items Addressed This Visit    Allergic reaction - Primary    Continues  to have these allergic reactions and has been able to pinpoint some foods. Given Depo Medrol injection, she will continue witch hazel/coconut oil. She wants to try to avoid steroids if possible due to tersting Advised to continue Allegra for another 5 days, then D/C If we can get this undercontrol she will be off meds for 2 weeks before her visit and testing can be done at that visit        Other Visit Diagnoses    Facial rash          Note: This dictation was prepared with Dragon dictation along with smaller phrase technology. Any transcriptional errors that result from this process are unintentional.

## 2016-05-21 NOTE — Addendum Note (Signed)
Addended by: Phillips OdorSIX, Raydell Maners H on: 05/21/2016 04:22 PM   Modules accepted: Orders

## 2016-05-21 NOTE — Patient Instructions (Addendum)
Take the allegra daily - STOP Sunday  SHot of steroid given

## 2016-05-21 NOTE — Assessment & Plan Note (Signed)
Continues to have these allergic reactions and has been able to pinpoint some foods. Given Depo Medrol injection, she will continue witch hazel/coconut oil. She wants to try to avoid steroids if possible due to tersting Advised to continue Allegra for another 5 days, then D/C If we can get this undercontrol she will be off meds for 2 weeks before her visit and testing can be done at that visit

## 2016-05-28 DIAGNOSIS — Z719 Counseling, unspecified: Secondary | ICD-10-CM | POA: Diagnosis not present

## 2016-06-06 ENCOUNTER — Encounter: Payer: Self-pay | Admitting: Allergy

## 2016-06-06 ENCOUNTER — Ambulatory Visit (INDEPENDENT_AMBULATORY_CARE_PROVIDER_SITE_OTHER): Payer: 59 | Admitting: Allergy

## 2016-06-06 VITALS — BP 132/90 | HR 78 | Temp 98.1°F | Resp 14 | Ht 60.0 in | Wt 230.2 lb

## 2016-06-06 DIAGNOSIS — T783XXD Angioneurotic edema, subsequent encounter: Secondary | ICD-10-CM | POA: Diagnosis not present

## 2016-06-06 DIAGNOSIS — L500 Allergic urticaria: Secondary | ICD-10-CM

## 2016-06-06 LAB — CBC WITH DIFFERENTIAL/PLATELET
BASOS PCT: 1 %
Basophils Absolute: 87 cells/uL (ref 0–200)
EOS ABS: 174 {cells}/uL (ref 15–500)
Eosinophils Relative: 2 %
HCT: 39.3 % (ref 35.0–45.0)
Hemoglobin: 12.6 g/dL (ref 11.7–15.5)
LYMPHS PCT: 29 %
Lymphs Abs: 2523 cells/uL (ref 850–3900)
MCH: 27.9 pg (ref 27.0–33.0)
MCHC: 32.1 g/dL (ref 32.0–36.0)
MCV: 86.9 fL (ref 80.0–100.0)
MONOS PCT: 8 %
MPV: 9 fL (ref 7.5–12.5)
Monocytes Absolute: 696 cells/uL (ref 200–950)
Neutro Abs: 5220 cells/uL (ref 1500–7800)
Neutrophils Relative %: 60 %
PLATELETS: 431 10*3/uL — AB (ref 140–400)
RBC: 4.52 MIL/uL (ref 3.80–5.10)
RDW: 15.3 % — AB (ref 11.0–15.0)
WBC: 8.7 10*3/uL (ref 3.8–10.8)

## 2016-06-06 NOTE — Progress Notes (Signed)
New Patient Note  RE: [name] MRN: [id number] DOB: July 10, 1960 Date of Office Visit: 06/06/2016  Referring provider: Alycia Rossetti, MD Primary care provider: Alycia Rossetti, MD  Chief Complaint: rash and swelling  History of present illness: Emily Aguirre is a 56 y.o. female presenting today for consultation for rash and swelling.   She feels something she is eating is breaking her out.  Rash and swelling started last summer 2017 but reports symptoms have been worse this year.  Reports the rash has been on arms and face around her mouth, neck.  She reports her face has been red and felt like it was burning and has had peeling.   The rash she describes as fine bumps that is very itchy and she likens the rash to looking like poison ivy.   She reports the rash may last about a week or two.   Rash occurs couple times a week.  She does endorse having 1 episode of lip swelling with the rash.   She denies any fever with rash however does feel she may have some joint aches with the rash and rash has left some residual bruising which does completely resolve.   She is concerned about sunflower seed or the oil as she reports she has had [employer] she reports after eating she felt tingly all over and she looked and sunflower seed oil was a main ingredient.  On a different occasion she put a lip balm on her lips and lips turn red and developed bumpy itchy rash around her mouth and main ingredient contained sunflower seed.  She report she was having peeling of her lips as well.  Also concerned about wheat products.  She reports she has been trying to be healthier in her diet and started eating wheat bread and developed facial redness and rash about an hour after ingestion.  She states she ate bbq chicken and developed similar rash after ingestion.  She is also concerned about artifical sweeteners as every time she has used them she developed similar rash as above.    She has been on  prednisone (21 day course) for her rash at the end of April.  Her last episode of rash was May 9 and was seen by PCP and received a steroid injection.   She reports rash cleared up quickly after the injection.   She has been on allegra (1 tab daily) which she states did not help as well as benadryl which hasn't help.  She also has put HC on the rash which also didn't seem to help.   She has been using Aveeno products.    Her diet consists of a lot of processed foods, salads, dairy, eggs, salmon, flounder.  She does not eat shellfish.  She reports she stopped eating nuts as she thought her symptoms could have been due to nut as she was eating M&M (caramel without nuts) and developed redness/rash.    She denies history or symptoms of allergic rhinoconjunctivitis, asthma or eczema.    Review of systems: Review of Systems  Constitutional: Negative for chills, fever and malaise/fatigue.  HENT: Negative for congestion, ear discharge, ear pain, nosebleeds, sinus pain, sore throat and tinnitus.   Eyes: Negative for discharge and redness.  Respiratory: Negative for cough, shortness of breath and wheezing.   Cardiovascular: Negative for chest pain.  Gastrointestinal: Negative for abdominal pain, diarrhea, heartburn, nausea and vomiting.  Musculoskeletal: Negative for joint pain and myalgias.  Skin: Positive for itching and rash.  Neurological: Negative for headaches.    All other systems negative unless noted above in HPI  Past medical history: Past Medical History:  Diagnosis Date  . Arthritis    bil knees  . GERD (gastroesophageal reflux disease)   . H/O abnormal Pap smear   . H/O hematuria     Past surgical history: Past Surgical History:  Procedure Laterality Date  . COLONOSCOPY N/A 05/20/2013   Procedure: COLONOSCOPY;  Surgeon: Danie Binder, MD;  Location: [facility];  Service: Endoscopy;  Laterality: N/A;  12:15-rescheduled to 8:30 Darius Bump to notify pt  . DILATION AND CURETTAGE  OF UTERUS  2001   D&E  . DILATION AND CURETTAGE OF UTERUS     x 2  due to misscarriages  . HYSTEROSCOPY W/D&C  09/05/2010   Procedure: DILATATION AND CURETTAGE (D&C) /HYSTEROSCOPY;  Surgeon: Elveria Royals;  Location: [facility];  Service: Gynecology;  Laterality: Bilateral;  Dilataion & Currettage With Hysteroscopy; Polypectomy  . IUD REMOVAL  09/05/2010   Procedure: INTRAUTERINE DEVICE (IUD) REMOVAL;  Surgeon: Elveria Royals;  Location: [facility];  Service: Gynecology;  Laterality: N/A;  Mirena IUD insertion.  Marland Kitchen LAPAROSCOPIC OVARIAN CYSTECTOMY  2000    Family history:  Family History  Problem Relation Age of Onset  . Colon cancer Other        paternal great uncle  . Hypertension Mother   . Allergic rhinitis Neg Hx   . Angioedema Neg Hx   . Asthma Neg Hx   . Eczema Neg Hx   . Immunodeficiency Neg Hx   . Urticaria Neg Hx     Social history: She lives in a home with carpeting in the bedroom with central cooling. There are dogs outside of the home. There is no concern for water damage, mildew or roaches in the home. She works in [employer]. She denies a smoking history.   Medication List: Allergies as of 06/06/2016   No Known Allergies     Medication List       Accurate as of 06/06/16 10:18 AM. Always use your most recent med list.          amLODipine 5 MG tablet Commonly known as:  NORVASC Take 1 tablet (5 mg total) by mouth daily.   hydrocortisone 2.5 % cream Apply topically 2 (two) times daily. To face   triamcinolone cream 0.1 % Commonly known as:  KENALOG Apply 1 application topically 2 (two) times daily. To arm       Known medication allergies: No Known Allergies   Physical examination: Blood pressure 132/90, pulse 78, temperature 98.1 F (36.7 C), temperature source Oral, resp. rate 14, height 5' (1.524 m), weight 230 lb 3.2 oz (104.4 kg), last menstrual period 11/16/2014, SpO2 97 %.  General: Alert, interactive, in no acute distress. HEENT:  PERRLA,TMs pearly gray, turbinates minimally edematous without discharge, post-pharynx non erythematous. Neck: Supple without lymphadenopathy. Lungs: Clear to auscultation without wheezing, rhonchi or rales. {no increased work of breathing. CV: Normal S1, S2 without murmurs. Abdomen: Nondistended, nontender. Skin: Warm and dry, without lesions or rashes. Extremities:  No clubbing, cyanosis or edema. Neuro:   Grossly intact.  Diagnositics/Labs:  Allergy testing: Food skin prick testing was negative for peanut and tree nuts, soybean, wheat, chicken. Allergy testing results were read and interpreted by provider, documented by clinical staff.   Assessment and plan:   Urticaria with angioedema    - Description of your rash  and swelling may be consistent with a form of urticaria (cholinergic).  You have identified several potential food triggers for your symptoms that were tested for today.  Food skin prick testing was negative today for peanut and tree nuts, soybean, wheat, chicken.   You do not have food allergy to these foods and do not need to avoid.   Discusssed that there is no testing at this time for artificial sweeteners and thus would avoid if causing issues after ingestion.     - We unfortunately do not have extract for sunflower seed for skin testing thus we will obtain serum IgE levels for this.    - Since you have had recurrent episodes of your rash and swelling over the course of a year will also obtain additional labs as follows: CBC with differential, CMP, chronic urticaria panel, ESR, tryptase level    - we also discussed performing patch testing today for assess potential contact dermatitis due to perioral rash following lip balm use and history of peeling with your rash.   Would need to be steroid free for 3 months prior to performing patch testing.        - to treat rash/swelling if returns take Allegra 151m 1 tab twice a day along with Zantac 155m1 tab twice a day.   May use  benadryl for breakthrough symptoms.         - recommend taking pictures of the rash if returns again and as you have done keep journal for foods/beverages eaten, medications, activities you have been doing to determine any consistent triggers.     Follow-up 3-4 months   I appreciate the opportunity to take part in Deosha's care. Please do not hesitate to contact me with questions.  Sincerely,   ShPrudy FeelerMD Allergy/[facility]

## 2016-06-06 NOTE — Patient Instructions (Addendum)
Rash and swelling    - Description of your rash and swelling may be consistent with a form of urticaria (hives).  You have identified several potential food triggers for your symptoms that were tested for today.  Food skin prick testing was negative today for peanut and tree nuts, soybean, wheat, chicken.   You do not have food allergy to these foods and do not need to avoid.   discusssed that there is no testing at this time for artificial sweeteners and thus would avoid if causing issues after ingestion.     - We unfortunately do not have extract for sunflower seed for skin testing thus we will obtain serum IgE levels for this.    - Since you have had recurrent episodes of your rash and swelling over the course of a year will also obtain additional labs as follows: CBC with differential, CMP, chronic urticaria panel, ESR, tryptase level    - we also discussed performing patch testing today for assess potential contact dermatitis due to perioral rash following lip balm use and history of peeling with your rash.   Would need to be steroid free for 3 months prior to performing patch testing.        - to treat rash/swelling if returns take Allegra 180mg 1 tab twice a day along with Zantac 150mg 1 tab twice a day.   May use benadryl for breakthrough symptoms.         - recommend taking pictures of the rash if returns again and as you have done keep journal for foods/beverages eaten, medications, activities you have been doing to determine any consistent triggers.     Follow-up 3-4 months   

## 2016-06-07 LAB — COMPREHENSIVE METABOLIC PANEL
ALK PHOS: 93 U/L (ref 33–130)
ALT: 16 U/L (ref 6–29)
AST: 16 U/L (ref 10–35)
Albumin: 3.7 g/dL (ref 3.6–5.1)
BUN: 16 mg/dL (ref 7–25)
CALCIUM: 9.5 mg/dL (ref 8.6–10.4)
CHLORIDE: 104 mmol/L (ref 98–110)
CO2: 21 mmol/L (ref 20–31)
Creat: 0.92 mg/dL (ref 0.50–1.05)
GLUCOSE: 83 mg/dL (ref 65–99)
POTASSIUM: 4.2 mmol/L (ref 3.5–5.3)
Sodium: 140 mmol/L (ref 135–146)
Total Bilirubin: 0.2 mg/dL (ref 0.2–1.2)
Total Protein: 7.1 g/dL (ref 6.1–8.1)

## 2016-06-07 LAB — SEDIMENTATION RATE: Sed Rate: 44 mm/hr — ABNORMAL HIGH (ref 0–30)

## 2016-06-10 LAB — ALLERGEN SUNFLOWER SEED K84: Sunflower Seed k84: <0.1 kU/L

## 2016-06-10 LAB — TRYPTASE: TRYPTASE: 5 ug/L (ref <11)

## 2016-06-11 LAB — CP CHRONIC URTICARIA INDEX PANEL
TSH: 2.78 mIU/L
Thyroperoxidase Ab SerPl-aCnc: 1 IU/mL (ref <9)

## 2016-06-25 DIAGNOSIS — Z719 Counseling, unspecified: Secondary | ICD-10-CM | POA: Diagnosis not present

## 2016-07-02 DIAGNOSIS — Z719 Counseling, unspecified: Secondary | ICD-10-CM | POA: Diagnosis not present

## 2016-07-09 DIAGNOSIS — Z719 Counseling, unspecified: Secondary | ICD-10-CM | POA: Diagnosis not present

## 2016-07-23 DIAGNOSIS — Z719 Counseling, unspecified: Secondary | ICD-10-CM | POA: Diagnosis not present

## 2016-07-25 ENCOUNTER — Encounter: Payer: 59 | Admitting: Family Medicine

## 2016-08-01 ENCOUNTER — Ambulatory Visit (INDEPENDENT_AMBULATORY_CARE_PROVIDER_SITE_OTHER): Payer: 59 | Admitting: Family Medicine

## 2016-08-01 ENCOUNTER — Encounter: Payer: Self-pay | Admitting: Family Medicine

## 2016-08-01 VITALS — BP 130/84 | HR 82 | Temp 98.4°F | Resp 14 | Ht 60.0 in | Wt 222.0 lb

## 2016-08-01 DIAGNOSIS — Z0000 Encounter for general adult medical examination without abnormal findings: Secondary | ICD-10-CM | POA: Diagnosis not present

## 2016-08-01 DIAGNOSIS — I10 Essential (primary) hypertension: Secondary | ICD-10-CM | POA: Diagnosis not present

## 2016-08-01 LAB — COMPREHENSIVE METABOLIC PANEL
ALK PHOS: 84 U/L (ref 33–130)
ALT: 12 U/L (ref 6–29)
AST: 16 U/L (ref 10–35)
Albumin: 3.8 g/dL (ref 3.6–5.1)
BUN: 11 mg/dL (ref 7–25)
CO2: 25 mmol/L (ref 20–31)
CREATININE: 0.99 mg/dL (ref 0.50–1.05)
Calcium: 9.1 mg/dL (ref 8.6–10.4)
Chloride: 103 mmol/L (ref 98–110)
Glucose, Bld: 83 mg/dL (ref 70–99)
POTASSIUM: 3.9 mmol/L (ref 3.5–5.3)
Sodium: 139 mmol/L (ref 135–146)
TOTAL PROTEIN: 6.9 g/dL (ref 6.1–8.1)
Total Bilirubin: 0.4 mg/dL (ref 0.2–1.2)

## 2016-08-01 LAB — CBC WITH DIFFERENTIAL/PLATELET
Basophils Absolute: 0 cells/uL (ref 0–200)
Basophils Relative: 0 %
EOS ABS: 201 {cells}/uL (ref 15–500)
Eosinophils Relative: 3 %
HCT: 38.8 % (ref 35.0–45.0)
HEMOGLOBIN: 12.5 g/dL (ref 12.0–15.0)
LYMPHS ABS: 2077 {cells}/uL (ref 850–3900)
Lymphocytes Relative: 31 %
MCH: 28.4 pg (ref 27.0–33.0)
MCHC: 32.2 g/dL (ref 32.0–36.0)
MCV: 88.2 fL (ref 80.0–100.0)
MONOS PCT: 9 %
MPV: 8.8 fL (ref 7.5–12.5)
Monocytes Absolute: 603 cells/uL (ref 200–950)
NEUTROS ABS: 3819 {cells}/uL (ref 1500–7800)
Neutrophils Relative %: 57 %
PLATELETS: 395 10*3/uL (ref 140–400)
RBC: 4.4 MIL/uL (ref 3.80–5.10)
RDW: 14.9 % (ref 11.0–15.0)
WBC: 6.7 10*3/uL (ref 3.8–10.8)

## 2016-08-01 LAB — LIPID PANEL
CHOL/HDL RATIO: 2.9 ratio (ref <5.0)
CHOLESTEROL: 214 mg/dL — AB (ref <200)
HDL: 73 mg/dL (ref >50)
LDL Cholesterol: 132 mg/dL — ABNORMAL HIGH (ref <100)
Triglycerides: 45 mg/dL (ref <150)
VLDL: 9 mg/dL (ref <30)

## 2016-08-01 MED ORDER — LIRAGLUTIDE -WEIGHT MANAGEMENT 18 MG/3ML ~~LOC~~ SOPN: 0.6000 mg | PEN_INJECTOR | Freq: Every day | SUBCUTANEOUS | 2 refills | Status: DC

## 2016-08-01 NOTE — Assessment & Plan Note (Signed)
Well controlled no changes 

## 2016-08-01 NOTE — Patient Instructions (Signed)
F/U 2 months  We will call with lab results  Start saxenda:  Week 1: 0.6mg  injected daily:   Week 2:  1.2 mg injected daily  Week 3:  1.8mg  injected daily  Week 4:  2.4mg  injected daily  Week 5:  3mg  injected daily Schedule your mammogram

## 2016-08-01 NOTE — Assessment & Plan Note (Signed)
After discussion of options She will continue with wellness/health coach Start Saxenda, discussed SE of medication and use If this is not covered by her insurance then I will let her try the phentermine again as she is working with a nutritionist help coach

## 2016-08-01 NOTE — Progress Notes (Signed)
   Subjective:    Patient ID: Emily Aguirre, female    DOB: 15-Jul-1960, 56 y.o.   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 ( she has a scale)   She has tried phentermine with some weight loss and regianed contrave little weight loss  wants to try something different or return to  phentermine      Review Of Systems:  GEN- denies fatigue, fever, weight loss,weakness, recent illness HEENT- denies eye drainage, change in vision, nasal discharge, CVS- denies chest pain, palpitations RESP- denies SOB, cough, wheeze ABD- denies N/V, change in stools, abd pain GU- denies dysuria, hematuria, dribbling, incontinence MSK- denies joint pain, muscle aches, injury Neuro- denies headache, dizziness, syncope, seizure activity       Objective:    BP 130/84   Pulse 82   Temp 98.4 F (36.9 C) (Oral)   Resp 14   Ht 5' (1.524 m)   Wt 222 lb (100.7 kg)   LMP 11/16/2014 Comment: irregular  SpO2 99%   BMI 43.36 kg/m  GEN- NAD, alert and oriented x3,obese  HEENT- PERRL, EOMI, non injected sclera, pink conjunctiva, MMM, oropharynx clear Neck- Supple, no thyromegaly CVS- RRR, no murmur RESP-CTAB ABD-NABS,soft,NT,ND EXT- No edema Pulses- Radial, DP- 2+        Assessment & Plan:      Problem List Items Addressed This Visit    Morbid obesity (HCC)    After discussion of options She will continue with wellness/health coach Start Saxenda, discussed SE of medication and use If this is not covered by her insurance then I will let her try the phentermine again as she is working with a nutritionist help coach      Relevant Medications   Liraglutide -Weight Management (SAXENDA) 18 MG/3ML SOPN   Hypertension    Well controlled no changes        Other Visit Diagnoses    Routine general medical examination at a health care facility    -  Primary   CPE done, wants to wait until we get shingix in stock, does not want at pharmacy. fasting labs today   Relevant Orders   CBC with Differential/Platelet   Comprehensive metabolic panel   Lipid panel      Note: This dictation was prepared with Dragon dictation along with smaller phrase technology. Any transcriptional errors that result from this process are unintentional.

## 2016-08-06 ENCOUNTER — Encounter: Payer: Self-pay | Admitting: *Deleted

## 2016-08-06 DIAGNOSIS — Z719 Counseling, unspecified: Secondary | ICD-10-CM | POA: Diagnosis not present

## 2016-08-07 ENCOUNTER — Telehealth: Payer: Self-pay | Admitting: *Deleted

## 2016-08-07 NOTE — Telephone Encounter (Signed)
Received request from pharmacy for PA on Saxenda.   PA submitted.   Dx: E66.01- Obesity.  

## 2016-08-07 NOTE — Telephone Encounter (Signed)
[employer] is reviewing your PA request. Typically an electronic response will be received within 72 hours. To check for an update later, open this request from your dashboard. 

## 2016-08-08 NOTE — Telephone Encounter (Signed)
Tell her not covered at goal Restart phentermine 37.5mg  tab, take 1/2 for 2 weeks, then go to full tablet Refill 2 F/U in office 2  months weight check

## 2016-08-08 NOTE — Telephone Encounter (Signed)
Received PA determination.   The requested product is a plan exclusion and there is no coverage criteria to review or apply.   MD please advise.

## 2016-08-09 ENCOUNTER — Other Ambulatory Visit: Payer: Self-pay | Admitting: Family Medicine

## 2016-08-11 NOTE — Telephone Encounter (Signed)
Call placed to patient. LMTRC.  

## 2016-08-12 MED ORDER — PHENTERMINE HCL 37.5 MG PO TABS: 37.5000 mg | ORAL_TABLET | Freq: Every day | ORAL | 2 refills | Status: DC

## 2016-08-12 NOTE — Telephone Encounter (Signed)
Call placed to patient and patient made aware.   Medication called to pharmacy. 

## 2016-08-13 DIAGNOSIS — Z719 Counseling, unspecified: Secondary | ICD-10-CM | POA: Diagnosis not present

## 2016-08-15 ENCOUNTER — Other Ambulatory Visit: Payer: Self-pay | Admitting: Family Medicine

## 2016-08-15 DIAGNOSIS — Z1231 Encounter for screening mammogram for malignant neoplasm of breast: Secondary | ICD-10-CM

## 2016-08-22 ENCOUNTER — Ambulatory Visit (HOSPITAL_COMMUNITY)
Admission: RE | Admit: 2016-08-22 | Discharge: 2016-08-22 | Disposition: A | Discharge status: Home / Self Care | Payer: 59 | Source: Ambulatory Visit | Attending: Family Medicine | Admitting: Family Medicine

## 2016-08-22 DIAGNOSIS — Z1231 Encounter for screening mammogram for malignant neoplasm of breast: Secondary | ICD-10-CM | POA: Insufficient documentation

## 2017-01-28 ENCOUNTER — Other Ambulatory Visit: Payer: Self-pay | Admitting: Family Medicine

## 2017-02-27 ENCOUNTER — Other Ambulatory Visit: Payer: Self-pay | Admitting: Family Medicine

## 2017-04-21 ENCOUNTER — Other Ambulatory Visit: Payer: Self-pay | Admitting: Family Medicine

## 2017-05-19 ENCOUNTER — Other Ambulatory Visit: Payer: Self-pay | Admitting: Family Medicine

## 2017-11-05 ENCOUNTER — Other Ambulatory Visit: Payer: Self-pay | Admitting: Family Medicine

## 2017-11-05 DIAGNOSIS — Z1231 Encounter for screening mammogram for malignant neoplasm of breast: Secondary | ICD-10-CM

## 2017-11-20 ENCOUNTER — Ambulatory Visit (HOSPITAL_COMMUNITY)
Admission: RE | Admit: 2017-11-20 | Discharge: 2017-11-20 | Disposition: A | Discharge status: Home / Self Care | Payer: 59 | Source: Ambulatory Visit | Attending: Family Medicine | Admitting: Family Medicine

## 2017-11-20 DIAGNOSIS — Z1231 Encounter for screening mammogram for malignant neoplasm of breast: Secondary | ICD-10-CM

## 2017-12-18 ENCOUNTER — Ambulatory Visit: Payer: 59 | Admitting: Family Medicine

## 2017-12-25 ENCOUNTER — Ambulatory Visit (INDEPENDENT_AMBULATORY_CARE_PROVIDER_SITE_OTHER): Payer: 59 | Admitting: Family Medicine

## 2017-12-25 ENCOUNTER — Other Ambulatory Visit: Payer: Self-pay

## 2017-12-25 ENCOUNTER — Encounter: Payer: Self-pay | Admitting: Family Medicine

## 2017-12-25 VITALS — BP 122/70 | HR 72 | Temp 98.3°F | Resp 12 | Ht 60.0 in | Wt 239.0 lb

## 2017-12-25 DIAGNOSIS — R195 Other fecal abnormalities: Secondary | ICD-10-CM

## 2017-12-25 DIAGNOSIS — I10 Essential (primary) hypertension: Secondary | ICD-10-CM

## 2017-12-25 DIAGNOSIS — K219 Gastro-esophageal reflux disease without esophagitis: Secondary | ICD-10-CM | POA: Diagnosis not present

## 2017-12-25 LAB — COMPREHENSIVE METABOLIC PANEL
AG RATIO: 1.1 (calc) (ref 1.0–2.5)
ALT: 14 U/L (ref 6–29)
AST: 18 U/L (ref 10–35)
Albumin: 4 g/dL (ref 3.6–5.1)
Alkaline phosphatase (APISO): 95 U/L (ref 33–130)
BUN: 13 mg/dL (ref 7–25)
CHLORIDE: 104 mmol/L (ref 98–110)
CO2: 27 mmol/L (ref 20–32)
CREATININE: 0.9 mg/dL (ref 0.50–1.05)
Calcium: 9.6 mg/dL (ref 8.6–10.4)
GLOBULIN: 3.8 g/dL — AB (ref 1.9–3.7)
GLUCOSE: 87 mg/dL (ref 65–99)
Potassium: 4.4 mmol/L (ref 3.5–5.3)
SODIUM: 141 mmol/L (ref 135–146)
TOTAL PROTEIN: 7.8 g/dL (ref 6.1–8.1)
Total Bilirubin: 0.4 mg/dL (ref 0.2–1.2)

## 2017-12-25 LAB — CBC WITH DIFFERENTIAL/PLATELET
BASOS PCT: 0.6 %
Basophils Absolute: 46 cells/uL (ref 0–200)
EOS PCT: 3.6 %
Eosinophils Absolute: 277 cells/uL (ref 15–500)
HCT: 40.7 % (ref 35.0–45.0)
HEMOGLOBIN: 13.4 g/dL (ref 11.7–15.5)
Lymphs Abs: 2349 cells/uL (ref 850–3900)
MCH: 28.2 pg (ref 27.0–33.0)
MCHC: 32.9 g/dL (ref 32.0–36.0)
MCV: 85.5 fL (ref 80.0–100.0)
MPV: 9.3 fL (ref 7.5–12.5)
Monocytes Relative: 8.7 %
NEUTROS ABS: 4358 {cells}/uL (ref 1500–7800)
Neutrophils Relative %: 56.6 %
Platelets: 443 10*3/uL — ABNORMAL HIGH (ref 140–400)
RBC: 4.76 10*6/uL (ref 3.80–5.10)
RDW: 13.8 % (ref 11.0–15.0)
Total Lymphocyte: 30.5 %
WBC mixed population: 670 cells/uL (ref 200–950)
WBC: 7.7 10*3/uL (ref 3.8–10.8)

## 2017-12-25 LAB — LIPID PANEL
CHOL/HDL RATIO: 3.1 (calc) (ref <5.0)
Cholesterol: 230 mg/dL — ABNORMAL HIGH (ref <200)
HDL: 74 mg/dL (ref >50)
LDL Cholesterol (Calc): 141 mg/dL (calc) — ABNORMAL HIGH
NON-HDL CHOLESTEROL (CALC): 156 mg/dL — AB (ref <130)
TRIGLYCERIDES: 55 mg/dL (ref <150)

## 2017-12-25 MED ORDER — PHENTERMINE HCL 37.5 MG PO TABS: 37.5000 mg | ORAL_TABLET | Freq: Every day | ORAL | 1 refills | Status: DC

## 2017-12-25 MED ORDER — AMLODIPINE BESYLATE 5 MG PO TABS: ORAL_TABLET | ORAL | 2 refills | Status: DC

## 2017-12-25 NOTE — Patient Instructions (Signed)
Restart the phentermine  1500 calories F/U 6 weeks

## 2017-12-25 NOTE — Progress Notes (Signed)
   Subjective:    Patient ID: Emily BarrowAlberta Y Aguirre, female    DOB: 10-18-1960, 57 y.o.   MRN: [id number]  Patient presents for Follow-up (is not fasting)     Pt here to f/u, last seen in July 2018. She has gained 17lbs since her last visit.    Morbid obesity- her insurance di dnot approve saxenda .wants to try weight loss medication again     HTN- she was not taking norvasc regulary, so her medication lasted a while,last took yesterday ,     She gets heartburn a lot   She does have some environmental allergies and uses pepcid, this has helped     Her stools have been oily over the past few months.    No blood in stools , no cramping no pain, no change in diet      Review Of Systems:  GEN- denies fatigue, fever, weight loss,weakness, recent illness HEENT- denies eye drainage, change in vision, nasal discharge, CVS- denies chest pain, palpitations RESP- denies SOB, cough, wheeze ABD- denies N/V, change in stools, abd pain GU- denies dysuria, hematuria, dribbling, incontinence MSK- denies joint pain, muscle aches, injury Neuro- denies headache, dizziness, syncope, seizure activity       Objective:    BP 122/70   Pulse 72   Temp 98.3 F (36.8 C) (Oral)   Resp 12   Ht 5' (1.524 m)   Wt 239 lb (108.4 kg)   LMP 11/16/2014 Comment: irregular  SpO2 98%   BMI 46.68 kg/m  GEN- NAD, alert and oriented x3 HEENT- PERRL, EOMI, non injected sclera, pink conjunctiva, MMM, oropharynx clear Neck- Supple, no thyromegaly CVS- RRR, no murmur RESP-CTAB ABD-NABS,soft,NT,ND EXT- No edema Pulses- Radial, DP- 2+        Assessment & Plan:      Problem List Items Addressed This Visit      Unprioritized   GERD (gastroesophageal reflux disease)    Continue pepcid      Hypertension - Primary    Controlled no changes to meds Check fasting labs today      Relevant Medications   amLODipine (NORVASC) 5 MG tablet   Other Relevant Orders   CBC with Differential/Platelet (Completed)    Comprehensive metabolic panel (Completed)   Lipid panel (Completed)   Morbid obesity (HCC)    If labs are normal, will try phentermine again, only thing covered by her insurance, she has treadmill now Discussed plate method for portion control, low carb diets, more water, veggies, protein      Relevant Medications   phentermine (ADIPEX-P) 37.5 MG tablet   Other Relevant Orders   Lipid panel (Completed)    Other Visit Diagnoses    Change in consistency of stool       check labs,may have some absorption problems, or pancreatic insuffiency with the oily stools, but no weight loss, pain, bleeding, scope UTD      Note: This dictation was prepared with Dragon dictation along with smaller phrase technology. Any transcriptional errors that result from this process are unintentional.

## 2017-12-27 NOTE — Assessment & Plan Note (Signed)
Continue pepcid

## 2017-12-27 NOTE — Assessment & Plan Note (Signed)
Controlled no changes to meds Check fasting labs today  

## 2017-12-27 NOTE — Assessment & Plan Note (Signed)
If labs are normal, will try phentermine again, only thing covered by her insurance, she has treadmill now Discussed plate method for portion control, low carb diets, more water, veggies, protein

## 2017-12-31 ENCOUNTER — Encounter: Payer: Self-pay | Admitting: *Deleted

## 2018-02-05 ENCOUNTER — Encounter: Payer: Self-pay | Admitting: Family Medicine

## 2018-02-05 ENCOUNTER — Other Ambulatory Visit: Payer: Self-pay

## 2018-02-05 ENCOUNTER — Ambulatory Visit (INDEPENDENT_AMBULATORY_CARE_PROVIDER_SITE_OTHER): Payer: 59 | Admitting: Family Medicine

## 2018-02-05 DIAGNOSIS — E782 Mixed hyperlipidemia: Secondary | ICD-10-CM

## 2018-02-05 DIAGNOSIS — E785 Hyperlipidemia, unspecified: Secondary | ICD-10-CM | POA: Insufficient documentation

## 2018-02-05 MED ORDER — PHENTERMINE HCL 37.5 MG PO TABS: 37.5000 mg | ORAL_TABLET | Freq: Every day | ORAL | 2 refills | Status: DC

## 2018-02-05 NOTE — Assessment & Plan Note (Signed)
Discussed dietary changes, cut out fried foods, baked goods

## 2018-02-05 NOTE — Assessment & Plan Note (Addendum)
Discussed dietary changes, being consistent with medication- continue phentermine Increase water to 6 cups a day Decrease sugary beverages Goal weight loss 10lbs next visit 1st week of April

## 2018-02-05 NOTE — Progress Notes (Signed)
   Subjective:    Patient ID: Emily Aguirre, female    DOB: 1960-11-28, [age] y.o.   MRN: [id number]  Patient presents for Follow-up (is fasting)   Weight down 4lbs since December has not been consistent with phentermine during the holidays, has over half a bottle left   She noticed she has lost some inches   She has been trying to be more active, she has an active [employer] game she likes to play   She has not been watching her calories or carbs due to the holidays   She admits to drinking her coffee with creamer, whipped cream, sugar    Review Of Systems:  GEN- denies fatigue, fever, weight loss,weakness, recent illness HEENT- denies eye drainage, change in vision, nasal discharge, CVS- denies chest pain, palpitations RESP- denies SOB, cough, wheeze ABD- denies N/V, change in stools, abd pain GU- denies dysuria, hematuria, dribbling, incontinence MSK- denies joint pain, muscle aches, injury Neuro- denies headache, dizziness, syncope, seizure activity       Objective:    BP 128/68   Pulse 60   Temp 98.5 F (36.9 C) (Oral)   Resp 14   Ht 5' (1.524 m)   Wt 235 lb (106.6 kg)   LMP 11/16/2014 Comment: irregular  SpO2 100%   BMI 45.90 kg/m  GEN- NAD, alert and oriented x3 HEENT- PERRL, EOMI, non injected sclera, pink conjunctiva, MMM, oropharynx clear CVS- RRR, no murmur RESP-CTAB EXT- No edema Pulses- Radial  2+        Assessment & Plan:      Problem List Items Addressed This Visit      Unprioritized   Hyperlipidemia    Discussed dietary changes, cut out fried foods, baked goods      Morbid obesity (HCC) - Primary    Discussed dietary changes, being consistent with medication- continue phentermine Increase water to 6 cups a day Decrease sugary beverages Goal weight loss 10lbs next visit 1st week of April      Relevant Medications   phentermine (ADIPEX-P) 37.5 MG tablet      Note: This dictation was prepared with Dragon dictation along with  smaller phrase technology. Any transcriptional errors that result from this process are unintentional.

## 2018-02-05 NOTE — Patient Instructions (Addendum)
GOals: drink 6 cups of water  Cut out the coffee  Cut down on fried foods Cut out sugary snacks  Shingles shot sent to pharmacy  F/U APril for Physical and weight

## 2018-04-16 ENCOUNTER — Encounter: Payer: 59 | Admitting: Family Medicine

## 2018-10-07 ENCOUNTER — Other Ambulatory Visit: Payer: Self-pay

## 2018-10-08 ENCOUNTER — Ambulatory Visit (HOSPITAL_COMMUNITY)
Admission: RE | Admit: 2018-10-08 | Discharge: 2018-10-08 | Disposition: A | Discharge status: Home / Self Care | Payer: 59 | Source: Ambulatory Visit | Attending: Family Medicine | Admitting: Family Medicine

## 2018-10-08 ENCOUNTER — Ambulatory Visit (INDEPENDENT_AMBULATORY_CARE_PROVIDER_SITE_OTHER): Payer: 59 | Admitting: Family Medicine

## 2018-10-08 ENCOUNTER — Encounter: Payer: Self-pay | Admitting: Family Medicine

## 2018-10-08 VITALS — BP 130/64 | HR 86 | Temp 98.5°F | Resp 16 | Ht 60.0 in | Wt 225.0 lb

## 2018-10-08 DIAGNOSIS — Z23 Encounter for immunization: Secondary | ICD-10-CM

## 2018-10-08 DIAGNOSIS — I10 Essential (primary) hypertension: Secondary | ICD-10-CM | POA: Diagnosis not present

## 2018-10-08 DIAGNOSIS — M79605 Pain in left leg: Secondary | ICD-10-CM | POA: Insufficient documentation

## 2018-10-08 DIAGNOSIS — M7989 Other specified soft tissue disorders: Secondary | ICD-10-CM | POA: Diagnosis present

## 2018-10-08 DIAGNOSIS — E782 Mixed hyperlipidemia: Secondary | ICD-10-CM

## 2018-10-08 NOTE — Assessment & Plan Note (Signed)
Well controlled no changes We will check her fasting labs today along with cholesterol.  She is working on dietary changes for her weight.   Her leg swelling she has good pulses however she does have some swelling behind the knee concerning for possible Baker's cyst.  I do not see the typical signs of significant DVT there is very minimal swelling around the ankle.  But she does have tenderness along the leg.  I do not see any bruising or signs of cellulitis or infection.  She is also had cramping in the muscles I will obtain a magnesium level

## 2018-10-08 NOTE — Progress Notes (Signed)
Subjective:    Patient ID: Emily Aguirre, female    DOB: 1960/02/13, 58 y.o.   MRN: [id number]  Patient presents for Cramping (x3 weeks- B legs cramping from knee down to ankle- L>R)   Pt here with cramping and ain down her legs in legs for the past 3 weeks or so. Left leg worse than right, only occ pain in right . On left leg behind knee is worse area and over shin   No injury, she has noticed a little swelling in her ankle and behind her knee   Taking BP meds Norvasc 5mg   She has used magnesium OTC, vinegar, mustart, tumeric and tylenol Unable to exercise due to pain No back pain   Obesity- no longer on phentermine , but weight is down 10lbs since Jan 2020, she is trying to watch her diet   Review Of Systems:  GEN- denies fatigue, fever, weight loss,weakness, recent illness HEENT- denies eye drainage, change in vision, nasal discharge, CVS- denies chest pain, palpitations RESP- denies SOB, cough, wheeze ABD- denies N/V, change in stools, abd pain GU- denies dysuria, hematuria, dribbling, incontinence MSK-+ joint pain, +muscle aches, injury Neuro- denies headache, dizziness, syncope, seizure activity       Objective:    BP 130/64   Pulse 86   Temp 98.5 F (36.9 C) (Oral)   Resp 16   Ht 5' (1.524 m)   Wt 225 lb (102.1 kg)   LMP 11/16/2014 Comment: irregular  SpO2 98%   BMI 43.94 kg/m  GEN- NAD, alert and oriented x3 HEENT- PERRL, EOMI, non injected sclera, pink conjunctiva, MMM, oropharynx clear Neck- Supple, no thyromegaly CVS- RRR, no murmur RESP-CTAB ABD-NABS,soft,NT,ND MSK- FAIR ROM SPINE, NEG SLR, NT on spine, bilat knee fair ROM mostly due to habitus, fullness behind left knee with tenderness  TTP along left leg, trace ankle swelling  Pulses- Radial, DP- 2+ PT  Palpated         Assessment & Plan:      Problem List Items Addressed This Visit      Unprioritized   Hyperlipidemia   Relevant Orders   Lipid panel   Hypertension - Primary    Well  controlled no changes We will check her fasting labs today along with cholesterol.  She is working on dietary changes for her weight.   Her leg swelling she has good pulses however she does have some swelling behind the knee concerning for possible Baker's cyst.  I do not see the typical signs of significant DVT there is very minimal swelling around the ankle.  But she does have tenderness along the leg.  I do not see any bruising or signs of cellulitis or infection.  She is also had cramping in the muscles I will obtain a magnesium level      Relevant Orders   CBC with Differential/Platelet   Comprehensive metabolic panel   Magnesium   Morbid obesity ([place])    Other Visit Diagnoses    Need for immunization against influenza       Relevant Orders   Flu Vaccine QUAD 36+ mos IM (Completed)   Left leg pain       Relevant Orders   US Venous Img Lower Unilateral Left   Magnesium   CK   Left leg swelling       Relevant Orders   US Venous Img Lower Unilateral Left      Note: This dictation was prepared with Dragon dictation along with smaller  Company secretary. Any transcriptional errors that result from this process are unintentional.

## 2018-10-12 LAB — CBC WITH DIFFERENTIAL/PLATELET
Absolute Monocytes: 601 cells/uL (ref 200–950)
Basophils Absolute: 47 cells/uL (ref 0–200)
Basophils Relative: 0.6 %
Eosinophils Absolute: 211 cells/uL (ref 15–500)
Eosinophils Relative: 2.7 %
HCT: 39.1 % (ref 35.0–45.0)
Hemoglobin: 12.5 g/dL (ref 11.7–15.5)
Lymphs Abs: 2387 cells/uL (ref 850–3900)
MCH: 28.2 pg (ref 27.0–33.0)
MCHC: 32 g/dL (ref 32.0–36.0)
MCV: 88.3 fL (ref 80.0–100.0)
MPV: 9.5 fL (ref 7.5–12.5)
Monocytes Relative: 7.7 %
Neutro Abs: 4555 cells/uL (ref 1500–7800)
Neutrophils Relative %: 58.4 %
Platelets: 407 10*3/uL — ABNORMAL HIGH (ref 140–400)
RBC: 4.43 10*6/uL (ref 3.80–5.10)
RDW: 14.5 % (ref 11.0–15.0)
Total Lymphocyte: 30.6 %
WBC: 7.8 10*3/uL (ref 3.8–10.8)

## 2018-10-12 LAB — COMPREHENSIVE METABOLIC PANEL
AG Ratio: 1.3 (calc) (ref 1.0–2.5)
ALT: 12 U/L (ref 6–29)
AST: 13 U/L (ref 10–35)
Albumin: 3.8 g/dL (ref 3.6–5.1)
Alkaline phosphatase (APISO): 82 U/L (ref 37–153)
BUN: 11 mg/dL (ref 7–25)
CO2: 25 mmol/L (ref 20–32)
Calcium: 8.7 mg/dL (ref 8.6–10.4)
Chloride: 106 mmol/L (ref 98–110)
Creat: 0.93 mg/dL (ref 0.50–1.05)
Globulin: 3 g/dL (calc) (ref 1.9–3.7)
Glucose, Bld: 83 mg/dL (ref 65–99)
Potassium: 4.1 mmol/L (ref 3.5–5.3)
Sodium: 143 mmol/L (ref 135–146)
Total Bilirubin: 0.3 mg/dL (ref 0.2–1.2)
Total Protein: 6.8 g/dL (ref 6.1–8.1)

## 2018-10-12 LAB — CK: Total CK: 203 U/L — ABNORMAL HIGH (ref 29–143)

## 2018-10-12 LAB — LIPID PANEL
Cholesterol: 202 mg/dL — ABNORMAL HIGH (ref <200)
HDL: 66 mg/dL (ref >=50)
LDL Cholesterol (Calc): 120 mg/dL (calc) — ABNORMAL HIGH
Non-HDL Cholesterol (Calc): 136 mg/dL (calc) — ABNORMAL HIGH (ref <130)
Total CHOL/HDL Ratio: 3.1 (calc) (ref <5.0)
Triglycerides: 69 mg/dL (ref <150)

## 2018-10-12 LAB — TEST AUTHORIZATION

## 2018-10-12 LAB — MAGNESIUM: Magnesium: 1.8 mg/dL (ref 1.5–2.5)

## 2018-10-13 ENCOUNTER — Other Ambulatory Visit: Payer: Self-pay | Admitting: *Deleted

## 2018-10-13 DIAGNOSIS — M791 Myalgia, unspecified site: Secondary | ICD-10-CM

## 2018-10-13 DIAGNOSIS — R748 Abnormal levels of other serum enzymes: Secondary | ICD-10-CM

## 2018-10-18 ENCOUNTER — Other Ambulatory Visit: Payer: Self-pay | Admitting: Family Medicine

## 2018-10-21 ENCOUNTER — Other Ambulatory Visit: Payer: Self-pay

## 2018-10-21 ENCOUNTER — Other Ambulatory Visit: Payer: 59

## 2018-10-21 DIAGNOSIS — M791 Myalgia, unspecified site: Secondary | ICD-10-CM

## 2018-10-21 DIAGNOSIS — R748 Abnormal levels of other serum enzymes: Secondary | ICD-10-CM

## 2018-10-22 LAB — CK: Total CK: 132 U/L (ref 29–143)

## 2018-10-28 ENCOUNTER — Encounter: Payer: Self-pay | Admitting: *Deleted

## 2019-01-10 ENCOUNTER — Other Ambulatory Visit (HOSPITAL_COMMUNITY): Payer: Self-pay | Admitting: Family Medicine

## 2019-01-10 DIAGNOSIS — Z1231 Encounter for screening mammogram for malignant neoplasm of breast: Secondary | ICD-10-CM

## 2019-01-13 ENCOUNTER — Other Ambulatory Visit: Payer: Self-pay

## 2019-01-13 ENCOUNTER — Ambulatory Visit (HOSPITAL_COMMUNITY)
Admission: RE | Admit: 2019-01-13 | Discharge: 2019-01-13 | Disposition: A | Discharge status: Home / Self Care | Payer: 59 | Source: Ambulatory Visit | Attending: Family Medicine | Admitting: Family Medicine

## 2019-01-13 DIAGNOSIS — Z1231 Encounter for screening mammogram for malignant neoplasm of breast: Secondary | ICD-10-CM | POA: Diagnosis not present

## 2019-03-26 ENCOUNTER — Other Ambulatory Visit: Payer: Self-pay

## 2019-03-26 ENCOUNTER — Ambulatory Visit: Payer: 59 | Attending: Internal Medicine

## 2019-03-26 DIAGNOSIS — Z23 Encounter for immunization: Secondary | ICD-10-CM

## 2019-03-26 NOTE — Progress Notes (Signed)
   Covid-19 Vaccination Clinic  Name:  GHISLAINE HARCUM    MRN: [id number] DOB: 23-Aug-1960  03/26/2019  Ms. Greeson was observed post Covid-19 immunization for 15 minutes without incident. She was provided with Vaccine Information Sheet and instruction to access the V-Safe system.   Ms. Springston was instructed to call 911 with any severe reactions post vaccine: Marland Kitchen Difficulty breathing  . Swelling of face and throat  . A fast heartbeat  . A bad rash all over body  . Dizziness and weakness   Immunizations Administered    Name Date Dose VIS Date Route   Moderna COVID-19 Vaccine 03/26/2019 10:37 AM 0.5 mL 12/14/2018 Intramuscular   Manufacturer: Moderna   Lot: [id number]   NDC: 45859-292-44

## 2019-04-27 ENCOUNTER — Ambulatory Visit: Payer: 59 | Attending: Internal Medicine

## 2019-04-27 DIAGNOSIS — Z23 Encounter for immunization: Secondary | ICD-10-CM

## 2019-04-27 NOTE — Progress Notes (Signed)
   Covid-19 Vaccination Clinic  Name:  ZYIAH WITHINGTON    MRN: [id number] DOB: 1960-04-06  04/27/2019  Ms. Brackeen was observed post Covid-19 immunization for 15 minutes without incident. She was provided with Vaccine Information Sheet and instruction to access the V-Safe system.   Ms. Hild was instructed to call 911 with any severe reactions post vaccine: Marland Kitchen Difficulty breathing  . Swelling of face and throat  . A fast heartbeat  . A bad rash all over body  . Dizziness and weakness   Immunizations Administered    Name Date Dose VIS Date Route   Moderna COVID-19 Vaccine 04/27/2019  9:47 AM 0.5 mL 12/14/2018 Intramuscular   Manufacturer: Moderna   Lot: [id number]   NDC: 75170-017-49

## 2019-07-01 ENCOUNTER — Ambulatory Visit (INDEPENDENT_AMBULATORY_CARE_PROVIDER_SITE_OTHER): Payer: 59 | Admitting: Family Medicine

## 2019-07-01 ENCOUNTER — Other Ambulatory Visit: Payer: Self-pay

## 2019-07-01 ENCOUNTER — Telehealth: Payer: Self-pay | Admitting: *Deleted

## 2019-07-01 ENCOUNTER — Encounter: Payer: Self-pay | Admitting: Family Medicine

## 2019-07-01 VITALS — BP 132/62 | HR 86 | Temp 98.3°F | Resp 14 | Ht 60.0 in | Wt 237.0 lb

## 2019-07-01 DIAGNOSIS — Z0000 Encounter for general adult medical examination without abnormal findings: Secondary | ICD-10-CM

## 2019-07-01 DIAGNOSIS — E782 Mixed hyperlipidemia: Secondary | ICD-10-CM

## 2019-07-01 DIAGNOSIS — Z124 Encounter for screening for malignant neoplasm of cervix: Secondary | ICD-10-CM

## 2019-07-01 DIAGNOSIS — Z0001 Encounter for general adult medical examination with abnormal findings: Secondary | ICD-10-CM | POA: Diagnosis not present

## 2019-07-01 DIAGNOSIS — R350 Frequency of micturition: Secondary | ICD-10-CM

## 2019-07-01 DIAGNOSIS — I10 Essential (primary) hypertension: Secondary | ICD-10-CM

## 2019-07-01 DIAGNOSIS — E669 Obesity, unspecified: Secondary | ICD-10-CM

## 2019-07-01 DIAGNOSIS — R3129 Other microscopic hematuria: Secondary | ICD-10-CM

## 2019-07-01 DIAGNOSIS — Z1159 Encounter for screening for other viral diseases: Secondary | ICD-10-CM

## 2019-07-01 DIAGNOSIS — Z114 Encounter for screening for human immunodeficiency virus [HIV]: Secondary | ICD-10-CM

## 2019-07-01 LAB — URINALYSIS, ROUTINE W REFLEX MICROSCOPIC
Bacteria, UA: NONE SEEN /HPF
Bilirubin Urine: NEGATIVE
Glucose, UA: NEGATIVE
Hyaline Cast: NONE SEEN /LPF
Ketones, ur: NEGATIVE
Leukocytes,Ua: NEGATIVE
Nitrite: NEGATIVE
Protein, ur: NEGATIVE
Specific Gravity, Urine: 1.015 (ref 1.001–1.03)
WBC, UA: NONE SEEN /HPF (ref 0–5)
pH: 6 (ref 5.0–8.0)

## 2019-07-01 LAB — MICROSCOPIC MESSAGE

## 2019-07-01 MED ORDER — OZEMPIC (0.25 OR 0.5 MG/DOSE) 2 MG/1.5ML ~~LOC~~ SOPN: 0.2500 mg | PEN_INJECTOR | SUBCUTANEOUS | 3 refills | Status: DC

## 2019-07-01 NOTE — Telephone Encounter (Signed)
Received request from pharmacy for PA on Ozempic.  PA submitted.   Dx: E88.9- Metabolic Dysfunction

## 2019-07-01 NOTE — Telephone Encounter (Signed)
[employer] is reviewing your PA request. Typically an electronic response will be received within 72 hours. To check for an update later, open this request from your dashboard. 

## 2019-07-01 NOTE — Progress Notes (Addendum)
Subjective:    Patient ID: Emily Aguirre, female    DOB: [birth year], 59 y.o.   MRN: [id number]  Patient presents for Gynecologic Exam (is fasting) and Urinary Frequency (freuent feeling of having to go with little urine at the time, no burning or discomfort)  Patient here for CPE with Pap.  Mammogram- up-to-date Colonoscopy up-to-date  Immunizations COVID-19 UTD/ TDAP UTD/FLU UTD  PAP Smear normal in  2017  Discussed shingles vaccine   Complains of urinary frequency for the past few months. Will urinate and have to come back in 30-40 minutes, no burning or pressure She does get up a lot at night to urinate She has tried cutting off the fluids  No caffiene   No vaginal bleeding   Review Of Systems:  GEN- denies fatigue, fever, weight loss,weakness, recent illness HEENT- denies eye drainage, change in vision, nasal discharge, CVS- denies chest pain, palpitations RESP- denies SOB, cough, wheeze ABD- denies N/V, change in stools, abd pain GU- denies dysuria, hematuria, dribbling, incontinence MSK- denies joint pain, muscle aches, injury Neuro- denies headache, dizziness, syncope, seizure activity       Objective:    BP 132/62    Pulse 86    Temp 98.3 F (36.8 C) (Temporal)    Resp 14    Ht 5' (1.524 m)    Wt 237 lb (107.5 kg)    LMP 11/16/2014 Comment: irregular   SpO2 97%    BMI 46.29 kg/m  GEN- NAD, alert and oriented x3 HEENT- PERRL, EOMI, non injected sclera, pink conjunctiva, MMM, oropharynx clear Neck- Supple, no thyromegaly Breast- normal symmetry, no nipple inversion,no nipple drainage, no nodules or lumps felt Nodes- no axillary nodes CVS- RRR, no murmur RESP-CTAB ABD-NABS,soft,NT,ND GU- normal external genitalia, vaginal mucosa pink and moist, cervix visualized no growth, stenosed os, no blood form os, No discharge, no CMT, no ovarian masses, uterus normal size EXT- No edema Pulses- Radial, DP- 2+    FALL/DEPRESSION/CAGE screen negative      Assessment & Plan:      Problem List Items Addressed This Visit      Unprioritized   Class 3 obesity    Pt with Metabolic syndrome.  I think she will benefit from GLP-1 therapy.  We will start her on Ozempic 0.25 mg once a week.  Fasting labs obtained today.  We will also check A1c.  Discussed lowering the carbohydrates in her diet.  She is also planning to purchase a Peloton exercise bike      Relevant Orders   Hemoglobin A1c (Completed)   Hyperlipidemia   Relevant Orders   Lipid panel (Completed)   Hemoglobin A1c (Completed)   Hypertension    Controlled no changes       Relevant Orders   TSH (Completed)    Other Visit Diagnoses    Routine general medical examination at health care facility    -  Primary   CPE done, PAP Smear with Co-Testing if negative repeat in 5 years, fasting labs, shingles vaccine to pharmacy   Relevant Orders   CBC with Differential/Platelet (Completed)   Comprehensive metabolic panel (Completed)   Lipid panel (Completed)   Urinary frequency       Relevant Orders   Urinalysis, Routine w reflex microscopic (Completed)   Urine Culture (Completed)   Microscopic hematuria       Most likely OAB, but pt has microscropic hematuria, urine culture to be sent, will plan to repeat urine studies based on results of  above   Relevant Orders   Urine Culture (Completed)   Cervical cancer screening       Relevant Orders   PAP,TP IMGw/HPV RNA,rflx [id number],88/32 (Completed)   Encounter for screening for HIV       Need for hepatitis C screening test          Note: This dictation was prepared with Dragon dictation along with smaller phrase technology. Any transcriptional errors that result from this process are unintentional.

## 2019-07-01 NOTE — Patient Instructions (Addendum)
Shingles vaccine get from pharmacy  Start ozempic 0.25mg   once a week  F/U 6 weeks

## 2019-07-01 NOTE — Assessment & Plan Note (Signed)
Controlled no changes 

## 2019-07-01 NOTE — Assessment & Plan Note (Addendum)
Pt with Metabolic syndrome.  I think she will benefit from GLP-1 therapy.  We will start her on Ozempic 0.25 mg once a week.  Fasting labs obtained today.  We will also check A1c.  Discussed lowering the carbohydrates in her diet.  She is also planning to purchase a Peloton exercise bike

## 2019-07-02 LAB — COMPREHENSIVE METABOLIC PANEL
AG Ratio: 1.2 (calc) (ref 1.0–2.5)
ALT: 14 U/L (ref 6–29)
AST: 17 U/L (ref 10–35)
Albumin: 3.9 g/dL (ref 3.6–5.1)
Alkaline phosphatase (APISO): 74 U/L (ref 37–153)
BUN: 15 mg/dL (ref 7–25)
CO2: 24 mmol/L (ref 20–32)
Calcium: 9.1 mg/dL (ref 8.6–10.4)
Chloride: 105 mmol/L (ref 98–110)
Creat: 0.88 mg/dL (ref 0.50–1.05)
Globulin: 3.2 g/dL (calc) (ref 1.9–3.7)
Glucose, Bld: 90 mg/dL (ref 65–99)
Potassium: 4.3 mmol/L (ref 3.5–5.3)
Sodium: 140 mmol/L (ref 135–146)
Total Bilirubin: 0.4 mg/dL (ref 0.2–1.2)
Total Protein: 7.1 g/dL (ref 6.1–8.1)

## 2019-07-02 LAB — CBC WITH DIFFERENTIAL/PLATELET
Absolute Monocytes: 518 cells/uL (ref 200–950)
Basophils Absolute: 50 cells/uL (ref 0–200)
Basophils Relative: 0.7 %
Eosinophils Absolute: 142 cells/uL (ref 15–500)
Eosinophils Relative: 2 %
HCT: 39.3 % (ref 35.0–45.0)
Hemoglobin: 12.8 g/dL (ref 11.7–15.5)
Lymphs Abs: 2265 cells/uL (ref 850–3900)
MCH: 28.6 pg (ref 27.0–33.0)
MCHC: 32.6 g/dL (ref 32.0–36.0)
MCV: 87.7 fL (ref 80.0–100.0)
MPV: 9.5 fL (ref 7.5–12.5)
Monocytes Relative: 7.3 %
Neutro Abs: 4125 cells/uL (ref 1500–7800)
Neutrophils Relative %: 58.1 %
Platelets: 381 10*3/uL (ref 140–400)
RBC: 4.48 10*6/uL (ref 3.80–5.10)
RDW: 14.3 % (ref 11.0–15.0)
Total Lymphocyte: 31.9 %
WBC: 7.1 10*3/uL (ref 3.8–10.8)

## 2019-07-02 LAB — LIPID PANEL
Cholesterol: 206 mg/dL — ABNORMAL HIGH (ref <200)
HDL: 63 mg/dL (ref >=50)
LDL Cholesterol (Calc): 129 mg/dL (calc) — ABNORMAL HIGH
Non-HDL Cholesterol (Calc): 143 mg/dL (calc) — ABNORMAL HIGH (ref <130)
Total CHOL/HDL Ratio: 3.3 (calc) (ref <5.0)
Triglycerides: 46 mg/dL (ref <150)

## 2019-07-02 LAB — URINE CULTURE
MICRO NUMBER:: 10608067
Result:: NO GROWTH
SPECIMEN QUALITY:: ADEQUATE

## 2019-07-02 LAB — HEMOGLOBIN A1C
Hgb A1c MFr Bld: 5.6 % of total Hgb (ref <5.7)
Mean Plasma Glucose: 114 (calc)
eAG (mmol/L): 6.3 (calc)

## 2019-07-02 LAB — TSH: TSH: 4.19 mIU/L (ref 0.40–4.50)

## 2019-07-05 LAB — PAP, TP IMAGING W/ HPV RNA, RFLX HPV TYPE 16,18/45: HPV DNA High Risk: NOT DETECTED

## 2019-07-13 ENCOUNTER — Telehealth: Payer: Self-pay | Admitting: *Deleted

## 2019-07-13 ENCOUNTER — Other Ambulatory Visit: Payer: Self-pay | Admitting: Family Medicine

## 2019-07-13 ENCOUNTER — Other Ambulatory Visit: Payer: Self-pay | Admitting: *Deleted

## 2019-07-13 ENCOUNTER — Encounter: Payer: Self-pay | Admitting: *Deleted

## 2019-07-13 MED ORDER — OXYBUTYNIN CHLORIDE ER 5 MG PO TB24: 5.0000 mg | ORAL_TABLET | Freq: Every day | ORAL | 3 refills | Status: DC

## 2019-07-13 MED ORDER — MIRABEGRON ER 50 MG PO TB24: 50.0000 mg | ORAL_TABLET | Freq: Every day | ORAL | 3 refills | Status: DC

## 2019-07-13 NOTE — Telephone Encounter (Signed)
Prescription sent to pharmacy.

## 2019-07-13 NOTE — Telephone Encounter (Signed)
Change to ditropan 5mg XL at bedtime

## 2019-07-13 NOTE — Telephone Encounter (Signed)
Received fax requesting alternative to Myrbetriq.  Advised that medication is not covered by insurance.   Requested to be changed to Ditropan.   MD please advise.

## 2019-07-17 ENCOUNTER — Other Ambulatory Visit: Payer: Self-pay | Admitting: Family Medicine

## 2019-08-12 ENCOUNTER — Other Ambulatory Visit: Payer: Self-pay

## 2019-08-12 ENCOUNTER — Ambulatory Visit (INDEPENDENT_AMBULATORY_CARE_PROVIDER_SITE_OTHER): Payer: 59 | Admitting: Family Medicine

## 2019-08-12 ENCOUNTER — Encounter: Payer: Self-pay | Admitting: Family Medicine

## 2019-08-12 DIAGNOSIS — E782 Mixed hyperlipidemia: Secondary | ICD-10-CM

## 2019-08-12 DIAGNOSIS — E669 Obesity, unspecified: Secondary | ICD-10-CM | POA: Diagnosis not present

## 2019-08-12 MED ORDER — OZEMPIC (0.25 OR 0.5 MG/DOSE) 2 MG/1.5ML ~~LOC~~ SOPN: 0.5000 mg | PEN_INJECTOR | SUBCUTANEOUS | 3 refills | Status: DC

## 2019-08-12 NOTE — Assessment & Plan Note (Addendum)
Good response to GLP-1 therapy, continue 0.5mg  weekly Continue to work on carb reduction/ reducing junk food between meals Add 15 minutes of physical activity daily F/U few months for meds/labs

## 2019-08-12 NOTE — Progress Notes (Signed)
   Subjective:    Patient ID: Emily Aguirre, female    DOB: 1960-04-22, 59 y.o.   MRN: [id number]  Patient presents for Follow-up (is fasting)   Pt here to f/u ozempicstarted 6 weeks ago for weight loss    SHe has cut down on carbohydrates, not eating bread with each meal like before, she is trying to eat   she does work out a Conservation officer, nature using a peddling bike/ellipital and tries to walk more at work . She admits she is not as consistent  No SE with the medication  Weight down 9lbs in the past 6 weeks She changed to 0.5mg  once a week, last week   She has mild hyperlipidemia treating with diet     Review Of Systems:  GEN- denies fatigue, fever, weight loss,weakness, recent illness HEENT- denies eye drainage, change in vision, nasal discharge, CVS- denies chest pain, palpitations RESP- denies SOB, cough, wheeze ABD- denies N/V, change in stools, abd pain GU- denies dysuria, hematuria, dribbling, incontinence MSK- denies joint pain, muscle aches, injury Neuro- denies headache, dizziness, syncope, seizure activity       Objective:    BP (!) 134/82   Pulse 76   Temp 97.8 F (36.6 C) (Temporal)   Resp 14   Ht 5' (1.524 m)   Wt (!) 228 lb (103.4 kg)   LMP 11/16/2014 Comment: irregular  SpO2 97%   BMI 44.53 kg/m  GEN- NAD, alert and oriented x3 HEENT- PERRL, EOMI, non injected sclera, pink conjunctiva, MMM, oropharynx clear Neck- Supple, no thyromegaly CVS- RRR, no murmur RESP-CTAB ABD-NABS,soft,NT,ND EXT- No edema Pulses- Radial2+        Assessment & Plan:      Problem List Items Addressed This Visit      Unprioritized   Class 3 obesity    Good response to GLP-1 therapy, continue 0.5mg  weekly Continue to work on carb reduction/ reducing junk food between meals Add 15 minutes of physical activity daily F/U few months for meds/labs      Hyperlipidemia    rewviewed last set of labs, treating with dietary changes and weight loss         Note: This  dictation was prepared with Dragon dictation along with smaller Lobbyist. Any transcriptional errors that result from this process are unintentional.

## 2019-08-12 NOTE — Assessment & Plan Note (Signed)
rewviewed last set of labs, treating with dietary changes and weight loss

## 2019-08-12 NOTE — Patient Instructions (Signed)
Continue ozempic 0.5mg  once a weekly  F/U 3 months ( Fasting)

## 2019-10-27 ENCOUNTER — Other Ambulatory Visit: Payer: Self-pay | Admitting: Family Medicine

## 2019-11-11 ENCOUNTER — Ambulatory Visit: Payer: 59 | Admitting: Family Medicine

## 2019-11-18 ENCOUNTER — Ambulatory Visit (INDEPENDENT_AMBULATORY_CARE_PROVIDER_SITE_OTHER): Payer: 59 | Admitting: Family Medicine

## 2019-11-18 ENCOUNTER — Encounter: Payer: Self-pay | Admitting: Family Medicine

## 2019-11-18 ENCOUNTER — Other Ambulatory Visit: Payer: Self-pay

## 2019-11-18 VITALS — BP 138/82 | HR 82 | Temp 98.3°F | Resp 16 | Ht 60.0 in | Wt 223.0 lb

## 2019-11-18 DIAGNOSIS — E782 Mixed hyperlipidemia: Secondary | ICD-10-CM

## 2019-11-18 DIAGNOSIS — I10 Essential (primary) hypertension: Secondary | ICD-10-CM | POA: Diagnosis not present

## 2019-11-18 DIAGNOSIS — E669 Obesity, unspecified: Secondary | ICD-10-CM

## 2019-11-18 MED ORDER — OZEMPIC (1 MG/DOSE) 2 MG/1.5ML ~~LOC~~ SOPN: 1.0000 mg | PEN_INJECTOR | SUBCUTANEOUS | 2 refills | Status: AC

## 2019-11-18 NOTE — Assessment & Plan Note (Signed)
Blood pressure controlled.  No change to medication.  She did take Alka-Seltzer last night as she felt some chest congestion

## 2019-11-18 NOTE — Assessment & Plan Note (Signed)
Increase Ozempic to 1 mg once a week she has had increase in her her readings are sweets.  Not discussed adding more protein in and vegetables with both lunch and dinner.  She is not a big breakfast eater but does eat a piece of fruit every morning.  We also discussed ways to get her more active.  She states that she had had a fall at home that she want to try to use so we discussed resistance training 2 days a week.  Some other type of aerobic activity that she enjoys 2 days a week.

## 2019-11-18 NOTE — Progress Notes (Signed)
   Subjective:    Patient ID: Emily Aguirre, female    DOB: September 16, 1960, 59 y.o.   MRN: [id number]  Patient presents for Follow-up (is fasting)   Started Ozempic June 18, at   238lbs  Pt here to f/u as  Ozempic 0.5mg  once a week   Weight 15 pounds in  4.5 months   When she first started taking the medication her appeited was more curved, but now noticed she is craving more sweets, she also missed a dose, when it was not available at the pharmacy   She is still drinking a good  amount of water  but has too manyt treats with candy bars /cookies   She has 2 good meals a day, she eats an apple for breakfast, then at 12:30pm has lunch, eats [place] meat/chips, she dinner at home - ex spagehetti , often starch, meat/ Oc veggie   She has not been exercising or walking   Review Of Systems:  GEN- denies fatigue, fever, weight loss,weakness, recent illness HEENT- denies eye drainage, change in vision, nasal discharge, CVS- denies chest pain, palpitations RESP- denies SOB, cough, wheeze ABD- denies N/V, change in stools, abd pain GU- denies dysuria, hematuria, dribbling, incontinence MSK- denies joint pain, muscle aches, injury Neuro- denies headache, dizziness, syncope, seizure activity       Objective:    BP 138/82   Pulse 82   Temp 98.3 F (36.8 C) (Temporal)   Resp 16   Ht 5' (1.524 m)   Wt 223 lb (101.2 kg)   LMP 11/16/2014 Comment: irregular  SpO2 97%   BMI 43.55 kg/m  GEN- NAD, alert and oriented x3 HEENT- PERRL, EOMI, non injected sclera, pink conjunctiva, MMM, oropharynx clear Neck- Supple, no thyromegaly CVS- RRR, no murmur RESP-CTAB EXT- No edema Pulses- Radial 2+        Assessment & Plan:      Problem List Items Addressed This Visit      Unprioritized   Class 3 obesity    Increase Ozempic to 1 mg once a week she has had increase in her her readings are sweets.  Not discussed adding more protein in and vegetables with both lunch and dinner.  She is not  a big breakfast eater but does eat a piece of fruit every morning.  We also discussed ways to get her more active.  She states that she had had a fall at home that she want to try to use so we discussed resistance training 2 days a week.  Some other type of aerobic activity that she enjoys 2 days a week.      Hyperlipidemia   Relevant Orders   Lipid panel   Hypertension - Primary    Blood pressure controlled.  No change to medication.  She did take Alka-Seltzer last night as she felt some chest congestion      Relevant Orders   Comprehensive metabolic panel      Note: This dictation was prepared with Dragon dictation along with smaller phrase technology. Any transcriptional errors that result from this process are unintentional.

## 2019-11-18 NOTE — Patient Instructions (Signed)
Add  In the kettle ball resistance training 2 days a week Increase ozempic 1mg  a week  Add veggies with lunch and dinner We will call with lab results  F/U 2 months

## 2019-11-19 LAB — COMPREHENSIVE METABOLIC PANEL
AG Ratio: 1.1 (calc) (ref 1.0–2.5)
ALT: 14 U/L (ref 6–29)
AST: 17 U/L (ref 10–35)
Albumin: 3.9 g/dL (ref 3.6–5.1)
Alkaline phosphatase (APISO): 102 U/L (ref 37–153)
BUN: 10 mg/dL (ref 7–25)
CO2: 27 mmol/L (ref 20–32)
Calcium: 9.4 mg/dL (ref 8.6–10.4)
Chloride: 104 mmol/L (ref 98–110)
Creat: 0.95 mg/dL (ref 0.50–1.05)
Globulin: 3.5 g/dL (calc) (ref 1.9–3.7)
Glucose, Bld: 84 mg/dL (ref 65–99)
Potassium: 4.4 mmol/L (ref 3.5–5.3)
Sodium: 141 mmol/L (ref 135–146)
Total Bilirubin: 0.4 mg/dL (ref 0.2–1.2)
Total Protein: 7.4 g/dL (ref 6.1–8.1)

## 2019-11-19 LAB — LIPID PANEL
Cholesterol: 220 mg/dL — ABNORMAL HIGH (ref <200)
HDL: 64 mg/dL (ref >=50)
LDL Cholesterol (Calc): 141 mg/dL (calc) — ABNORMAL HIGH
Non-HDL Cholesterol (Calc): 156 mg/dL (calc) — ABNORMAL HIGH (ref <130)
Total CHOL/HDL Ratio: 3.4 (calc) (ref <5.0)
Triglycerides: 60 mg/dL (ref <150)

## 2019-11-22 ENCOUNTER — Other Ambulatory Visit: Payer: Self-pay

## 2019-11-22 DIAGNOSIS — E782 Mixed hyperlipidemia: Secondary | ICD-10-CM

## 2019-11-22 MED ORDER — ATORVASTATIN CALCIUM 10 MG PO TABS: 10.0000 mg | ORAL_TABLET | Freq: Every day | ORAL | 2 refills | Status: DC

## 2020-01-20 ENCOUNTER — Other Ambulatory Visit: Payer: Self-pay

## 2020-01-20 ENCOUNTER — Encounter: Payer: Self-pay | Admitting: Family Medicine

## 2020-01-20 ENCOUNTER — Ambulatory Visit: Payer: 59 | Admitting: Family Medicine

## 2020-01-20 VITALS — BP 130/80 | HR 90 | Temp 98.0°F | Resp 14 | Ht 60.0 in | Wt 222.0 lb

## 2020-01-20 DIAGNOSIS — I10 Essential (primary) hypertension: Secondary | ICD-10-CM | POA: Diagnosis not present

## 2020-01-20 DIAGNOSIS — E782 Mixed hyperlipidemia: Secondary | ICD-10-CM | POA: Diagnosis not present

## 2020-01-20 DIAGNOSIS — E669 Obesity, unspecified: Secondary | ICD-10-CM

## 2020-01-20 DIAGNOSIS — N939 Abnormal uterine and vaginal bleeding, unspecified: Secondary | ICD-10-CM

## 2020-01-20 DIAGNOSIS — R319 Hematuria, unspecified: Secondary | ICD-10-CM

## 2020-01-20 DIAGNOSIS — N841 Polyp of cervix uteri: Secondary | ICD-10-CM

## 2020-01-20 LAB — URINALYSIS, ROUTINE W REFLEX MICROSCOPIC
Bacteria, UA: NONE SEEN /HPF
Bilirubin Urine: NEGATIVE
Glucose, UA: NEGATIVE
Hyaline Cast: NONE SEEN /LPF
Ketones, ur: NEGATIVE
Leukocytes,Ua: NEGATIVE
Nitrite: NEGATIVE
Protein, ur: NEGATIVE
Specific Gravity, Urine: 1.025 (ref 1.001–1.03)
pH: 7 (ref 5.0–8.0)

## 2020-01-20 LAB — MICROSCOPIC MESSAGE

## 2020-01-20 NOTE — Assessment & Plan Note (Signed)
Weight loss of 1 lb since visit in November Discussed nutrition, going back to reduced carbs/sweets Consistently with physical activity reassess in 2 months Check lipids, LFT would not recommend stopping statin

## 2020-01-20 NOTE — Progress Notes (Signed)
Subjective:    Patient ID: Emily Aguirre, female    DOB: 1960/02/06, 60 y.o.   MRN: [id number]  Patient presents for Follow-up (Is fasting/) and Kidney Infection (Back pain, hematuria/)   Pt here to f/u medications   She has noticed pink tinge to urine on and off since past month or so, especially when she wiped. No UTI symptoms, she is concerned it may be coming vaginal  Area . No vaginal discharge noted.  She did notice some mild discomfort in right thoracic region, but  Not in flank region She has taken [facility] powder which helped No change in bowels She does have history of kidney stones and history of hematuria/renal cyst     Class obesity 3 - she has take Ozempic she has lost 1lb since last viist But admits she has been indulging through the holidays She does have a virtual fit group - she has kettle balls but has not been consistent with any work out   HLD she is on lipitor, due for recheck, she feels this may make her back hurt at times, but no lower ext cramps or pain    Review Of Systems:  GEN- denies fatigue, fever, weight loss,weakness, recent illness HEENT- denies eye drainage, change in vision, nasal discharge, CVS- denies chest pain, palpitations RESP- denies SOB, cough, wheeze ABD- denies N/V, change in stools, abd pain GU- denies dysuria, +hematuria, dribbling, incontinence MSK- denies joint pain, +muscle aches, injury Neuro- denies headache, dizziness, syncope, seizure activity       Objective:    BP 130/80   Pulse 90   Temp 98 F (36.7 C)   Resp 14   Ht 5' (1.524 m)   Wt 222 lb (100.7 kg)   LMP 11/16/2014 Comment: irregular  SpO2 99%   BMI 43.36 kg/m  GEN- NAD, alert and oriented x3 CVS- RRR, no murmur RESP-CTAB ABD-NABS,soft,NT,ND, no CVA tenderness  MSK- Spine NT, good ROM Mild right mid thoracic parapinals tenderness GU- normal external genitalia, vaginal mucosa pink and moist, cervix visualized + large polyps coming out of os +blood, No  discharge, no CMT, no ovarian masses, uterus normal size EXT- No edema Pulses- Radial 2+        Assessment & Plan:      Problem List Items Addressed This Visit      Unprioritized   Class 3 obesity    Weight loss of 1 lb since visit in November Discussed nutrition, going back to reduced carbs/sweets Consistently with physical activity reassess in 2 months Check lipids, LFT would not recommend stopping statin      Hyperlipidemia   Relevant Orders   Lipid panel   Hypertension   Relevant Orders   Comprehensive metabolic panel    Other Visit Diagnoses    Hematuria, unspecified type    -  Primary   History of hematuria, no UTI symptoms, I htink blood is coming from vaginal region, but culture sent no sign of infection   Relevant Orders   Urinalysis, Routine w reflex microscopic (Completed)   Urine Culture   Cervical polyp       Referral to GYN for treatment/removal    Relevant Orders   Ambulatory referral to Gynecology   Vaginal bleeding       Relevant Orders   Ambulatory referral to Gynecology      Note: This dictation was prepared with Dragon dictation along with smaller phrase technology. Any transcriptional errors that result from this process are unintentional.

## 2020-01-20 NOTE — Patient Instructions (Addendum)
Referral to GYN for cervical polyp We will call with lab results Continue ozempic F/U 2 months for weight medication

## 2020-01-21 LAB — COMPREHENSIVE METABOLIC PANEL
AG Ratio: 1.2 (calc) (ref 1.0–2.5)
ALT: 20 U/L (ref 6–29)
AST: 19 U/L (ref 10–35)
Albumin: 4.1 g/dL (ref 3.6–5.1)
Alkaline phosphatase (APISO): 100 U/L (ref 37–153)
BUN: 14 mg/dL (ref 7–25)
CO2: 30 mmol/L (ref 20–32)
Calcium: 9.4 mg/dL (ref 8.6–10.4)
Chloride: 105 mmol/L (ref 98–110)
Creat: 1.05 mg/dL (ref 0.50–1.05)
Globulin: 3.4 g/dL (calc) (ref 1.9–3.7)
Glucose, Bld: 89 mg/dL (ref 65–99)
Potassium: 4.1 mmol/L (ref 3.5–5.3)
Sodium: 142 mmol/L (ref 135–146)
Total Bilirubin: 0.3 mg/dL (ref 0.2–1.2)
Total Protein: 7.5 g/dL (ref 6.1–8.1)

## 2020-01-21 LAB — URINE CULTURE
MICRO NUMBER:: 11394364
SPECIMEN QUALITY:: ADEQUATE

## 2020-01-21 LAB — LIPID PANEL
Cholesterol: 143 mg/dL (ref <200)
HDL: 63 mg/dL (ref >=50)
LDL Cholesterol (Calc): 68 mg/dL (calc)
Non-HDL Cholesterol (Calc): 80 mg/dL (calc) (ref <130)
Total CHOL/HDL Ratio: 2.3 (calc) (ref <5.0)
Triglycerides: 43 mg/dL (ref <150)

## 2020-01-25 ENCOUNTER — Encounter: Payer: Self-pay | Admitting: *Deleted

## 2020-02-10 ENCOUNTER — Encounter: Payer: 59 | Admitting: Obstetrics & Gynecology

## 2020-02-20 ENCOUNTER — Other Ambulatory Visit: Payer: Self-pay | Admitting: Family Medicine

## 2020-02-20 DIAGNOSIS — E782 Mixed hyperlipidemia: Secondary | ICD-10-CM

## 2020-02-21 ENCOUNTER — Other Ambulatory Visit: Payer: Self-pay | Admitting: Family Medicine

## 2020-02-24 ENCOUNTER — Encounter: Payer: Self-pay | Admitting: Obstetrics & Gynecology

## 2020-02-24 ENCOUNTER — Other Ambulatory Visit: Payer: Self-pay

## 2020-02-24 ENCOUNTER — Other Ambulatory Visit: Payer: Self-pay | Admitting: Obstetrics & Gynecology

## 2020-02-24 ENCOUNTER — Ambulatory Visit: Payer: Managed Care, Other (non HMO) | Admitting: Obstetrics & Gynecology

## 2020-02-24 VITALS — BP 140/81 | HR 85 | Ht 61.0 in | Wt 222.0 lb

## 2020-02-24 DIAGNOSIS — N841 Polyp of cervix uteri: Secondary | ICD-10-CM | POA: Diagnosis not present

## 2020-02-24 NOTE — Progress Notes (Signed)
Endocervical polyp removal  Pre-operative Diagnosis: Endocervical polyp with associated spotting  Post-operative Diagnosis: same  Indications: polyp  Procedure Details   Urine pregnancy test was not done.  The risks (including infection, bleeding, pain, and uterine perforation) and benefits of the procedure were explained to the patient and Written informed consent was obtained.  Antibiotic prophylaxis against endocarditis was not indicated.   The patient was placed in the dorsal lithotomy position.  Bimanual exam showed the uterus to be in the neutral position.  A Graves' speculum inserted in the vagina, and the cervix prepped with povidone iodine.  Endocervical curettage with a Kevorkian curette was not performed.   A ring forcep was used and the polyp was grasped. A twisting motion was used and a portion of the polyp was removed I then used a straight Boseman to grasp the retracted polyp stalk I used the ring forcep and Boseman to "wal" the reminder of the polyp and stalk out of the cervix Then used a twisting technique to remove fully  Hemostasis was achieved using Monsel's solution    Sample was sent for pathologic examination.  Condition: Stable  Complications: None  Plan:  The patient was advised to call for any fever or for prolonged or severe pain or bleeding. She was advised to use OTC analgesics as needed for mild to moderate pain. She was advised to avoid vaginal intercourse for 48 hours or until the bleeding has completely stopped.  Attending Physician Documentation: I performed the procedure

## 2020-03-01 ENCOUNTER — Encounter (HOSPITAL_COMMUNITY): Payer: Self-pay | Admitting: Emergency Medicine

## 2020-03-01 ENCOUNTER — Emergency Department (HOSPITAL_COMMUNITY): Payer: Managed Care, Other (non HMO)

## 2020-03-01 ENCOUNTER — Other Ambulatory Visit: Payer: Self-pay

## 2020-03-01 ENCOUNTER — Emergency Department (HOSPITAL_COMMUNITY)
Admission: EM | Admit: 2020-03-01 | Discharge: 2020-03-01 | Disposition: A | Discharge status: Home / Self Care | Payer: Managed Care, Other (non HMO) | Attending: Emergency Medicine | Admitting: Emergency Medicine

## 2020-03-01 DIAGNOSIS — Z79899 Other long term (current) drug therapy: Secondary | ICD-10-CM | POA: Insufficient documentation

## 2020-03-01 DIAGNOSIS — K219 Gastro-esophageal reflux disease without esophagitis: Secondary | ICD-10-CM | POA: Insufficient documentation

## 2020-03-01 DIAGNOSIS — N200 Calculus of kidney: Secondary | ICD-10-CM

## 2020-03-01 DIAGNOSIS — R109 Unspecified abdominal pain: Secondary | ICD-10-CM | POA: Diagnosis present

## 2020-03-01 DIAGNOSIS — I10 Essential (primary) hypertension: Secondary | ICD-10-CM | POA: Diagnosis not present

## 2020-03-01 LAB — COMPREHENSIVE METABOLIC PANEL
ALT: 19 U/L (ref 0–44)
AST: 24 U/L (ref 15–41)
Albumin: 3.7 g/dL (ref 3.5–5.0)
Alkaline Phosphatase: 87 U/L (ref 38–126)
Anion gap: 9 (ref 5–15)
BUN: 16 mg/dL (ref 6–20)
CO2: 24 mmol/L (ref 22–32)
Calcium: 8.9 mg/dL (ref 8.9–10.3)
Chloride: 102 mmol/L (ref 98–111)
Creatinine, Ser: 1.69 mg/dL — ABNORMAL HIGH (ref 0.44–1.00)
GFR, Estimated: 35 mL/min — ABNORMAL LOW (ref >60)
Glucose, Bld: 110 mg/dL — ABNORMAL HIGH (ref 70–99)
Potassium: 3.6 mmol/L (ref 3.5–5.1)
Sodium: 135 mmol/L (ref 135–145)
Total Bilirubin: 0.5 mg/dL (ref 0.3–1.2)
Total Protein: 7.4 g/dL (ref 6.5–8.1)

## 2020-03-01 LAB — URINALYSIS, ROUTINE W REFLEX MICROSCOPIC
Bacteria, UA: NONE SEEN
Bilirubin Urine: NEGATIVE
Glucose, UA: NEGATIVE mg/dL
Ketones, ur: NEGATIVE mg/dL
Leukocytes,Ua: NEGATIVE
Nitrite: NEGATIVE
Protein, ur: NEGATIVE mg/dL
Specific Gravity, Urine: 1.012 (ref 1.005–1.030)
pH: 7 (ref 5.0–8.0)

## 2020-03-01 LAB — CBC WITH DIFFERENTIAL/PLATELET
Abs Immature Granulocytes: 0.05 10*3/uL (ref 0.00–0.07)
Basophils Absolute: 0 10*3/uL (ref 0.0–0.1)
Basophils Relative: 0 %
Eosinophils Absolute: 0 10*3/uL (ref 0.0–0.5)
Eosinophils Relative: 0 %
HCT: 38.4 % (ref 36.0–46.0)
Hemoglobin: 12.5 g/dL (ref 12.0–15.0)
Immature Granulocytes: 0 %
Lymphocytes Relative: 13 %
Lymphs Abs: 1.7 10*3/uL (ref 0.7–4.0)
MCH: 29.2 pg (ref 26.0–34.0)
MCHC: 32.6 g/dL (ref 30.0–36.0)
MCV: 89.7 fL (ref 80.0–100.0)
Monocytes Absolute: 0.7 10*3/uL (ref 0.1–1.0)
Monocytes Relative: 6 %
Neutro Abs: 9.9 10*3/uL — ABNORMAL HIGH (ref 1.7–7.7)
Neutrophils Relative %: 81 %
Platelets: 377 10*3/uL (ref 150–400)
RBC: 4.28 MIL/uL (ref 3.87–5.11)
RDW: 14.2 % (ref 11.5–15.5)
WBC: 12.4 10*3/uL — ABNORMAL HIGH (ref 4.0–10.5)
nRBC: 0 % (ref 0.0–0.2)

## 2020-03-01 LAB — LIPASE, BLOOD: Lipase: 31 U/L (ref 11–51)

## 2020-03-01 MED ORDER — SODIUM CHLORIDE 0.9 % IV BOLUS
500.0000 mL | Freq: Once | INTRAVENOUS | Status: AC
  Administered 2020-03-01: 500 mL via INTRAVENOUS

## 2020-03-01 MED ORDER — OXYCODONE-ACETAMINOPHEN 5-325 MG PO TABS: 2.0000 | ORAL_TABLET | ORAL | 0 refills | Status: DC | PRN

## 2020-03-01 MED ORDER — ONDANSETRON HCL 4 MG/2ML IJ SOLN
4.0000 mg | Freq: Once | INTRAMUSCULAR | Status: AC
  Administered 2020-03-01: 4 mg via INTRAVENOUS
  Filled 2020-03-01: qty 2

## 2020-03-01 MED ORDER — TAMSULOSIN HCL 0.4 MG PO CAPS: 0.4000 mg | ORAL_CAPSULE | Freq: Every day | ORAL | 0 refills | Status: DC

## 2020-03-01 MED ORDER — KETOROLAC TROMETHAMINE 30 MG/ML IJ SOLN
30.0000 mg | Freq: Once | INTRAMUSCULAR | Status: AC
  Administered 2020-03-01: 30 mg via INTRAVENOUS
  Filled 2020-03-01: qty 1

## 2020-03-01 NOTE — ED Notes (Signed)
Patient transported to CT 

## 2020-03-01 NOTE — ED Triage Notes (Signed)
Pt c/o left flank pain that started about 24 hours ago. Pt has been taking medication for gas thinking that would fix the pain but hasn't relieved the pain.

## 2020-03-01 NOTE — ED Provider Notes (Signed)
[facility] EMERGENCY DEPARTMENT Provider Note   CSN: [id number] Arrival date & time: 03/01/20  0510     History Chief Complaint  Patient presents with   Flank Pain    Emily Aguirre is a 60 y.o. female.  Patient is a 60 year old female with history of hypertension, hyperlipidemia, renal calculi.  Patient presents today for evaluation of left flank pain.  This started approximately 24 hours ago in the absence of any injury or trauma.  She describes constant, significant discomfort to the left flank with no radiation into the groin or abdomen.  She denies any bowel or bladder complaints.  She denies any fevers or chills.  The history is provided by the patient.  Flank Pain This is a new problem. Episode onset: 24 hours ago. The problem occurs constantly. The problem has not changed since onset.Nothing aggravates the symptoms. Nothing relieves the symptoms. She has tried nothing for the symptoms.       Past Medical History:  Diagnosis Date   Arthritis    bil knees   GERD (gastroesophageal reflux disease)    H/O abnormal Pap smear    H/O hematuria    Hypertension    Phreesia 06/28/2019    Patient Active Problem List   Diagnosis Date Noted   Hyperlipidemia 02/05/2018   Allergic reaction 04/04/2016   Hypertension 12/17/2015   Renal cyst 12/17/2015   Class 3 obesity 01/11/2014   FH: colon cancer 04/22/2013   GERD (gastroesophageal reflux disease)     Past Surgical History:  Procedure Laterality Date   COLONOSCOPY N/A 05/20/2013   Procedure: COLONOSCOPY;  Surgeon: West Bali, MD;  Location: [facility]ITE;  Service: Endoscopy;  Laterality: N/A;  12:15-rescheduled to 8:30 Soledad Gerlach to notify pt   DILATION AND CURETTAGE OF UTERUS  2001   D&E   DILATION AND CURETTAGE OF UTERUS     x 2  due to misscarriages   HYSTEROSCOPY WITH D & C  09/05/2010   Procedure: DILATATION AND CURETTAGE (D&C) /HYSTEROSCOPY;  Surgeon: Robley Fries;  Location: [facility] ORS;   Service: Gynecology;  Laterality: Bilateral;  Dilataion & Currettage With Hysteroscopy; Polypectomy   IUD REMOVAL  09/05/2010   Procedure: INTRAUTERINE DEVICE (IUD) REMOVAL;  Surgeon: Robley Fries;  Location: [facility] ORS;  Service: Gynecology;  Laterality: N/A;  Mirena IUD insertion.   LAPAROSCOPIC OVARIAN CYSTECTOMY  2000     OB History    Gravida  2   Para      Term      Preterm      AB  2   Living  0     SAB  2   IAB      Ectopic      Multiple      Live Births  0           Family History  Problem Relation Age of Onset   Colon cancer Other        paternal great uncle   Hypertension Mother    COPD Mother    Heart disease Mother    Diabetes Father    Allergic rhinitis Neg Hx    Angioedema Neg Hx    Asthma Neg Hx    Eczema Neg Hx    Immunodeficiency Neg Hx    Urticaria Neg Hx     Social History   Tobacco Use   Smoking status: Never Smoker   Smokeless tobacco: Never Used  Vaping Use   Vaping Use: Never  used  Substance Use Topics   Alcohol use: No   Drug use: No    Home Medications Prior to Admission medications   Medication Sig Start Date End Date Taking? Authorizing Provider  amLODipine (NORVASC) 5 MG tablet Take 5 mg by mouth daily. 02/09/20   [provider]  atorvastatin (LIPITOR) 10 MG tablet TAKE 1 TABLET BY MOUTH EVERYDAY AT BEDTIME 02/20/20   Barlow, Velna Hatchet, MD  oxybutynin (DITROPAN-XL) 5 MG 24 hr tablet TAKE 1 TABLET BY MOUTH EVERYDAY AT BEDTIME Patient not taking: Reported on 02/24/2020 02/21/20   Salley Scarlet, MD  Semaglutide, 1 MG/DOSE, (OZEMPIC, 1 MG/DOSE,) 2 MG/1.5ML SOPN Inject 1 mg into the skin once a week. 11/18/19   Salley Scarlet, MD    Allergies    Patient has no known allergies.  Review of Systems   Review of Systems  Genitourinary: Positive for flank pain.  All other systems reviewed and are negative.   Physical Exam Updated Vital Signs BP (!) 155/90    Pulse (!) 103    Temp 98.5 F  (36.9 C) (Oral)    Resp 18    Ht 5\' 1"  (1.549 m)    Wt 100.2 kg    LMP 11/16/2014 Comment: irregular   SpO2 97%    BMI 41.76 kg/m   Physical Exam Vitals and nursing note reviewed.  Constitutional:      General: She is not in acute distress.    Appearance: She is well-developed and well-nourished. She is not diaphoretic.  HENT:     Head: Normocephalic and atraumatic.  Cardiovascular:     Rate and Rhythm: Normal rate and regular rhythm.     Heart sounds: No murmur heard. No friction rub. No gallop.   Pulmonary:     Effort: Pulmonary effort is normal. No respiratory distress.     Breath sounds: Normal breath sounds. No wheezing.  Abdominal:     General: Bowel sounds are normal. There is no distension.     Palpations: Abdomen is soft. There is no mass.     Tenderness: There is no abdominal tenderness. There is left CVA tenderness. There is no right CVA tenderness, guarding or rebound.  Musculoskeletal:        General: Normal range of motion.     Cervical back: Normal range of motion and neck supple.  Skin:    General: Skin is warm and dry.  Neurological:     Mental Status: She is alert and oriented to person, place, and time.     ED Results / Procedures / Treatments   Labs (all labs ordered are listed, but only abnormal results are displayed) Labs Reviewed  COMPREHENSIVE METABOLIC PANEL  LIPASE, BLOOD  CBC WITH DIFFERENTIAL/PLATELET  URINALYSIS, ROUTINE W REFLEX MICROSCOPIC    EKG None  Radiology No results found.  Procedures Procedures   Medications Ordered in ED Medications  ketorolac (TORADOL) 30 MG/ML injection 30 mg (30 mg Intravenous Given 03/01/20 0539)  ondansetron (ZOFRAN) injection 4 mg (4 mg Intravenous Given 03/01/20 0539)  sodium chloride 0.9 % bolus 500 mL (500 mLs Intravenous New Bag/Given 03/01/20 0539)    ED Course  I have reviewed the triage vital signs and the nursing notes.  Pertinent labs & imaging results that were available during my care  of the patient were reviewed by me and considered in my medical decision making (see chart for details).    MDM Rules/Calculators/A&P  Patient is a 60 year old female presenting here with complaints  of left flank pain that appears to be caused by a 3 mm renal calculus in the left proximal ureter.  Patient has no fever, no sign of infection in her urine, and no white count.  She does have a large renal cyst on the left side.  Patient feeling better after medications given here in the ER.  At this point, patient appears appropriate for discharge.  She will be given pain medication, Flomax, and advised to follow-up with urology if the stone has not passed over the next few days.  Final Clinical Impression(s) / ED Diagnoses Final diagnoses:  None    Rx / DC Orders ED Discharge Orders    None       Geoffery Lyons, MD 03/01/20 [phone]

## 2020-03-01 NOTE — Discharge Instructions (Signed)
Begin taking Percocet as prescribed as needed for pain.  Begin taking Flomax as prescribed.  Follow-up with [facility] if you are not feeling better on Monday.  Their contact information has been provided in this discharge summary for you to call and make these arrangements.

## 2020-03-02 ENCOUNTER — Telehealth: Payer: Self-pay

## 2020-03-02 ENCOUNTER — Other Ambulatory Visit: Payer: Self-pay | Admitting: Family Medicine

## 2020-03-02 ENCOUNTER — Telehealth: Payer: Self-pay | Admitting: Family Medicine

## 2020-03-02 MED ORDER — TRAMADOL HCL 50 MG PO TABS: 50.0000 mg | ORAL_TABLET | Freq: Four times a day (QID) | ORAL | 0 refills | Status: DC | PRN

## 2020-03-02 NOTE — Telephone Encounter (Signed)
Pt called stated she went to the ER last night for kidney stones. Pt wanted Dr. Jeanice Lim to give prescription for pain and nausea anything but oxy.  Cb#: [id number]

## 2020-03-02 NOTE — Telephone Encounter (Signed)
I will send in tramadol. 

## 2020-03-02 NOTE — Telephone Encounter (Signed)
Transition Care Management Unsuccessful Follow-up Telephone Call  Date of discharge and from where:  03/01/2020 from [facility]  Attempts:  1st Attempt  Reason for unsuccessful TCM follow-up call:  Left voice message     

## 2020-03-02 NOTE — Telephone Encounter (Signed)
Transition Care Management Follow-up Telephone Call  Date of discharge and from where: 03/01/2020 from [facility]  How have you been since you were released from the hospital? Pt stated that she is feeling a little bit better.   Any questions or concerns? No  Items Reviewed:  Did the pt receive and understand the discharge instructions provided? Yes   Medications obtained and verified? Yes   Other? No   Any new allergies since your discharge? No   Dietary orders reviewed?   Do you have support at home? Yes   Functional Questionnaire: (I = Independent and D = Dependent) ADLs: I  Bathing/Dressing- I  Meal Prep- I  Eating- I  Maintaining continence- I  Transferring/Ambulation- I  Managing Meds- I   Follow up appointments reviewed:   PCP Hospital f/u appt confirmed? No  Pt encouraged to call pcp for follow up.   Specialist Hospital f/u appt confirmed? No  Pcp encourage to contact urology.   Are transportation arrangements needed? No  If their condition worsens, is the pt aware to call PCP or go to the Emergency Dept.? Yes Was the patient provided with contact information for the PCP's office or ED? Yes Was to pt encouraged to call back with questions or concerns? Yes

## 2020-03-02 NOTE — Telephone Encounter (Signed)
Patient was given Oxycodone/APAP and Flomax from hospital.   Please advise.

## 2020-03-05 ENCOUNTER — Other Ambulatory Visit: Payer: Self-pay

## 2020-03-05 ENCOUNTER — Encounter: Payer: Self-pay | Admitting: Urology

## 2020-03-05 ENCOUNTER — Ambulatory Visit (INDEPENDENT_AMBULATORY_CARE_PROVIDER_SITE_OTHER): Payer: Managed Care, Other (non HMO) | Admitting: Urology

## 2020-03-05 VITALS — BP 148/84 | HR 130 | Temp 98.3°F

## 2020-03-05 DIAGNOSIS — N201 Calculus of ureter: Secondary | ICD-10-CM

## 2020-03-05 DIAGNOSIS — N281 Cyst of kidney, acquired: Secondary | ICD-10-CM

## 2020-03-05 LAB — MICROSCOPIC EXAMINATION
Bacteria, UA: NONE SEEN
Renal Epithel, UA: NONE SEEN /hpf

## 2020-03-05 LAB — URINALYSIS, ROUTINE W REFLEX MICROSCOPIC
Bilirubin, UA: NEGATIVE
Glucose, UA: NEGATIVE
Ketones, UA: NEGATIVE
Nitrite, UA: NEGATIVE
Protein,UA: NEGATIVE
Specific Gravity, UA: 1.01 (ref 1.005–1.030)
Urobilinogen, Ur: 0.2 mg/dL (ref 0.2–1.0)
pH, UA: 7 (ref 5.0–7.5)

## 2020-03-05 MED ORDER — ONDANSETRON HCL 4 MG PO TABS: 4.0000 mg | ORAL_TABLET | Freq: Three times a day (TID) | ORAL | 0 refills | Status: DC | PRN

## 2020-03-05 NOTE — Progress Notes (Signed)
[facility] [place]   03/05/2020 1:27 PM   Emily Aguirre [birth year] [id number]  Referring provider: Salley Scarlet, MD [address] [place],  [place] [address]  CC: 3 mm left prox stone   HPI:  Emily Aguirre was referred for a 3 mm left proximal ureteral stone.  She developed left flank pain ("real bad") and underwent a CT scan 03/01/2020 which revealed a left 3 mm proximal stone and mild to moderate hydronephrosis.  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 Her creatinine was 1.69.  UA with 21-50 red cells and no bacteria.  White count 12.  Prior urine culture mixed.  She hasn't seen a stone pass. No further left flank pain. UA today clear. Microhematuria resolved.   She passed another stone yrs ago - maybe 2010.   She has a h/o frequency and nocturia. She takes oxybutynin.   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     PMH: Past Medical History:  Diagnosis Date  . Arthritis    bil knees  . GERD (gastroesophageal reflux disease)   . H/O abnormal Pap smear   . H/O hematuria   . Hypertension    Phreesia 06/28/2019    Surgical History: Past Surgical History:  Procedure Laterality Date  . COLONOSCOPY N/A 05/20/2013   Procedure: COLONOSCOPY;  Surgeon: West Bali, MD;  [facility];  Service: Endoscopy;  Laterality: N/A;  12:15-rescheduled to 8:30 Soledad Gerlach to notify pt  . DILATION AND CURETTAGE OF UTERUS  2001   D&E  . DILATION AND CURETTAGE OF UTERUS     x 2  due to misscarriages  . HYSTEROSCOPY WITH D & C  09/05/2010   Procedure: DILATATION AND CURETTAGE (D&C) /HYSTEROSCOPY;  Surgeon: Robley Fries;  Location: [facility];  Service: Gynecology;  Laterality: Bilateral;  Dilataion & Currettage With Hysteroscopy; Polypectomy  . IUD REMOVAL  09/05/2010   Procedure: INTRAUTERINE DEVICE (IUD) REMOVAL;  Surgeon: Robley Fries;  Location: [facility];  Service: Gynecology;  Laterality: N/A;  Mirena IUD insertion.  Marland Kitchen LAPAROSCOPIC OVARIAN  CYSTECTOMY  2000    Home Medications:  Allergies as of 03/05/2020   No Known Allergies     Medication List       Accurate as of March 05, 2020  1:27 PM. If you have any questions, ask your nurse or doctor.        amLODipine 5 MG tablet Commonly known as: NORVASC Take 5 mg by mouth daily.   atorvastatin 10 MG tablet Commonly known as: LIPITOR TAKE 1 TABLET BY MOUTH EVERYDAY AT BEDTIME   ondansetron 4 MG tablet Commonly known as: Zofran Take 1 tablet (4 mg total) by mouth every 8 (eight) hours as needed for nausea or vomiting.   oxybutynin 5 MG 24 hr tablet Commonly known as: DITROPAN-XL TAKE 1 TABLET BY MOUTH EVERYDAY AT BEDTIME   Ozempic (1 MG/DOSE) 2 MG/1.5ML Sopn Generic drug: Semaglutide (1 MG/DOSE) Inject 1 mg into the skin once a week.   tamsulosin 0.4 MG Caps capsule Commonly known as: Flomax Take 1 capsule (0.4 mg total) by mouth daily.   traMADol 50 MG tablet Commonly known as: ULTRAM Take 1 tablet (50 mg total) by mouth every 6 (six) hours as needed for up to 10 days.       Allergies: No Known Allergies  Family History: Family History  Problem Relation Age of Onset  . Colon cancer Other  paternal great uncle  . Hypertension Mother   . COPD Mother   . Heart disease Mother   . Diabetes Father   . Allergic rhinitis Neg Hx   . Angioedema Neg Hx   . Asthma Neg Hx   . Eczema Neg Hx   . Immunodeficiency Neg Hx   . Urticaria Neg Hx     Social History:  reports that she has never smoked. She has never used smokeless tobacco. She reports that she does not drink alcohol and does not use drugs.   Physical Exam: LMP 11/16/2014 Comment: irregular  Constitutional:  Alert and oriented, No acute distress. HEENT: [place] AT, moist mucus membranes.  Trachea midline, no masses. Cardiovascular: No clubbing, cyanosis, or edema. Respiratory: Normal respiratory effort, no increased work of breathing. GI: Abdomen is soft, nontender, nondistended, no  abdominal masses GU: No CVA tenderness Skin: No rashes, bruises or suspicious lesions. Neurologic: Grossly intact, no focal deficits, moving all 4 extremities. Psychiatric: Normal mood and affect.  Laboratory Data: Lab Results  Component Value Date   WBC 12.4 (H) 03/01/2020   HGB 12.5 03/01/2020   HCT 38.4 03/01/2020   MCV 89.7 03/01/2020   PLT 377 03/01/2020    Lab Results  Component Value Date   CREATININE 1.69 (H) 03/01/2020    No results found for: PSA  No results found for: TESTOSTERONE  Lab Results  Component Value Date   HGBA1C 5.6 07/01/2019    Urinalysis    Component Value Date/Time   COLORURINE STRAW (A) 03/01/2020 0600   APPEARANCEUR CLEAR 03/01/2020 0600   LABSPEC 1.012 03/01/2020 0600   PHURINE 7.0 03/01/2020 0600   GLUCOSEU NEGATIVE 03/01/2020 0600   HGBUR MODERATE (A) 03/01/2020 0600   BILIRUBINUR NEGATIVE 03/01/2020 0600   KETONESUR NEGATIVE 03/01/2020 0600   PROTEINUR NEGATIVE 03/01/2020 0600   UROBILINOGEN 0.2 04/16/2012 0304   NITRITE NEGATIVE 03/01/2020 0600   LEUKOCYTESUR NEGATIVE 03/01/2020 0600    Lab Results  Component Value Date   BACTERIA NONE SEEN 03/01/2020    Pertinent Imaging: CT scan 2022  No results found for this or any previous visit.  No results found for this or any previous visit.  No results found for this or any previous visit.  No results found for this or any previous visit.  Results for orders placed during the hospital encounter of 01/18/16  US Renal  Narrative CLINICAL DATA:  Apparent cystic areas on recent lumbar MRI  EXAM: RENAL / URINARY TRACT ULTRASOUND COMPLETE  COMPARISON:  Lumbar MRI December 09, 2015; CT abdomen and pelvis December 08, 2008  FINDINGS: Right Kidney:  Length: 11.9 cm. Echogenicity and renal cortical thickness are within normal limits. No perinephric fluid or hydronephrosis visualized. There is a simple cyst in the lower pole right kidney measuring 2.6 x 1.8 x 1.7 cm.  There is no sonographically demonstrable calculus or ureterectasis.  Left Kidney:  Length: 11.6 cm. Echogenicity and renal cortical thickness are within normal limits. No perinephric fluid or hydronephrosis visualized. There is a simple cyst arising from the lower pole of the left kidney measuring 8.7 x 6.8 x 8.8 cm. There is no sonographically demonstrable calculus or ureterectasis.  Bladder:  Appears normal for degree of bladder distention.  IMPRESSION: Cysts arising from both kidneys. Note that the cyst arising from the lower pole of the left kidney has become considerably larger compared to prior study from 2010. Study otherwise unremarkable.   Electronically Signed By: Bretta Bang III M.D. On: 01/18/2016 15:17  No results found for this or any previous visit.  No results found for this or any previous visit.  Results for orders placed during the hospital encounter of 03/01/20  CT Renal Stone Study  Narrative CLINICAL DATA:  Flank pain, left side.  Kidney stone suspected.  EXAM: CT ABDOMEN AND PELVIS WITHOUT CONTRAST  TECHNIQUE: Multidetector CT imaging of the abdomen and pelvis was performed following the standard protocol without IV contrast.  COMPARISON:  Ultrasound renal 01/18/2016.  FINDINGS: Lower chest: No acute abnormality.  Hepatobiliary: No focal liver abnormality. Suspected peripherally calcified gallstone within the gallbladder lumen. No gallbladder wall thickening or pericholecystic fluid. No biliary dilatation.  Pancreas: No focal lesion. Normal pancreatic contour. No surrounding inflammatory changes. No main pancreatic ductal dilatation.  Spleen: Normal in size without focal abnormality.  Adrenals/Urinary Tract: Trace left perinephric fat stranding. Moderate left hydronephrosis with associated 3 mm proximal left ureteral calcified stone. Proximal to the stone there is mild hydroureter. No nephrolithiasis bilaterally. No  right ureterolithiasis.  A 9.5 cm fluid density lesion within the left kidney likely represents a simple renal cyst. Subcentimeter hypodensity within the left kidney is too small to characterize. Suggestion of renal cortical scarring (2:34, 5:63). Fluid density lesion noted within the right inferior pole measuring up to 3.2 cm likely represents a simple renal cyst. Difficult to delineate suggestion of a right peripelvic cyst.  Stomach/Bowel: PO contrast reaches the rectum. Stomach is within normal limits. No evidence of bowel wall thickening or dilatation. Appendix appears normal.  Vascular/Lymphatic: Suggestion of phleboliths within the left pelvis (2:81). No abdominal aorta or iliac aneurysm. Mild atherosclerotic plaque of the aorta and its branches. No abdominal, pelvic, or inguinal lymphadenopathy.  Reproductive: Uterus and bilateral adnexa are unremarkable.  Other: No intraperitoneal free fluid. No intraperitoneal free gas. No organized fluid collection.  Musculoskeletal:  Possible tiny umbilical hernia.  No suspicious lytic or blastic osseous lesions. No acute displaced fracture. Multilevel degenerative changes of the spine.  IMPRESSION: 1. Obstructive 3 mm left proximal ureterolithiasis. Correlate with urinalysis to evaluate for a superimposed infection. 2. Possible cholelithiasis. If clinically indicated, consider right upper quadrant ultrasound for further evaluation.   Electronically Signed By: Tish Frederickson M.D. On: 03/01/2020 06:25   Assessment & Plan:    1. Renal cyst Disc benign nature - check renal US   2. Left ureteral stone - as above - [place] to ensure hydro resolved. We discussed off label use of alpha blockers, stone passage and URS. She will continue tams and stone passage. Check [place] in 2-3 weeks ensure resolution of hydro. Disc diet changes to prevent stones.   No follow-ups on file.  Jerilee Field, MD

## 2020-03-05 NOTE — Telephone Encounter (Signed)
Call placed to patient and patient made aware.  

## 2020-03-05 NOTE — Progress Notes (Signed)
Urological Symptom Review  Patient is experiencing the following symptoms: Frequent urination Get up at night to urinate Blood in urine Urinary tract infection  Patient feels like she has  A UTI  Review of Systems  Gastrointestinal (upper)  : Negative for upper GI symptoms  Gastrointestinal (lower) : Negative for lower GI symptoms  Constitutional : Negative for symptoms  Skin: negative  Eyes: Negative for eye symptoms  Ear/Nose/Throat : Negative for Ear/Nose/Throat symptoms  Hematologic/Lymphatic: Negative for Hematologic/Lymphatic symptoms  Cardiovascular : Negative for cardiovascular symptoms  Respiratory : Negative for respiratory symptoms  Endocrine: Negative for endocrine symptoms  Musculoskeletal: Negative for musculoskeletal symptoms  Neurological: Negative for neurological symptoms  Psychologic: Negative for psychiatric symptoms

## 2020-03-05 NOTE — Patient Instructions (Signed)

## 2020-03-30 ENCOUNTER — Ambulatory Visit (HOSPITAL_COMMUNITY)
Admission: RE | Admit: 2020-03-30 | Discharge: 2020-03-30 | Disposition: A | Discharge status: Home / Self Care | Payer: Managed Care, Other (non HMO) | Source: Ambulatory Visit | Attending: Urology | Admitting: Urology

## 2020-03-30 DIAGNOSIS — N201 Calculus of ureter: Secondary | ICD-10-CM | POA: Insufficient documentation

## 2020-03-30 DIAGNOSIS — N281 Cyst of kidney, acquired: Secondary | ICD-10-CM

## 2020-04-02 ENCOUNTER — Ambulatory Visit: Payer: Managed Care, Other (non HMO) | Admitting: Urology

## 2020-04-16 ENCOUNTER — Ambulatory Visit (INDEPENDENT_AMBULATORY_CARE_PROVIDER_SITE_OTHER): Payer: Managed Care, Other (non HMO) | Admitting: Urology

## 2020-04-16 ENCOUNTER — Other Ambulatory Visit: Payer: Self-pay

## 2020-04-16 ENCOUNTER — Encounter: Payer: Self-pay | Admitting: Urology

## 2020-04-16 VITALS — BP 120/75 | HR 90 | Temp 98.3°F | Ht 61.0 in | Wt 216.0 lb

## 2020-04-16 DIAGNOSIS — N281 Cyst of kidney, acquired: Secondary | ICD-10-CM | POA: Diagnosis not present

## 2020-04-16 DIAGNOSIS — N201 Calculus of ureter: Secondary | ICD-10-CM

## 2020-04-16 NOTE — Progress Notes (Signed)
Urological Symptom Review  Patient is experiencing the following symptoms: Negative    Review of Systems  Gastrointestinal (upper)  : Heartburn  Gastrointestinal (lower) : Negative for lower GI symptoms  Constitutional : Negative for symptoms  Skin: Negative for skin symptoms  Eyes: Negative for eye symptoms  Ear/Nose/Throat : Negative for Ear/Nose/Throat symptoms  Hematologic/Lymphatic: Negative for Hematologic/Lymphatic symptoms  Cardiovascular : Negative for cardiovascular symptoms  Respiratory : Negative for respiratory symptoms  Endocrine: Negative for endocrine symptoms  Musculoskeletal: Back pain  Neurological: Negative for neurological symptoms  Psychologic: Negative for psychiatric symptoms

## 2020-04-16 NOTE — Progress Notes (Signed)
04/16/2020 3:14 PM   Emily Aguirre 1960-05-03 [id number]  Referring provider: Salley Scarlet, MD [address] [place],  [place] [address]  No chief complaint on file.   HPI:  F/u -   1) left proximal ureteral stone, renal cyst - she developed left flank pain ("real bad") and underwent a CT scan 03/01/2020 which revealed a left 3 mm proximal stone and mild to moderate hydronephrosis.  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 Her creatinine was 1.69.  UA with 21-50 red cells and no bacteria.  White count 12.  Prior urine culture mixed. F/u UA was clear - no blood.   She passed another stone yrs ago - maybe 2010.   Renal US was done 04/02/2020 and was benign. No stone or hydro noted. LLP cyst and two right renal cysts noted. She never saw a stone pass.   2) frequency and nocturia - she takes oxybutynin.   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    PMH: Past Medical History:  Diagnosis Date  . Arthritis    bil knees  . GERD (gastroesophageal reflux disease)   . H/O abnormal Pap smear   . H/O hematuria   . Hypertension    Phreesia 06/28/2019    Surgical History: Past Surgical History:  Procedure Laterality Date  . COLONOSCOPY N/A 05/20/2013   Procedure: COLONOSCOPY;  Surgeon: West Bali, MD;  [facility];  Service: Endoscopy;  Laterality: N/A;  12:15-rescheduled to 8:30 Soledad Gerlach to notify pt  . DILATION AND CURETTAGE OF UTERUS  2001   D&E  . DILATION AND CURETTAGE OF UTERUS     x 2  due to misscarriages  . HYSTEROSCOPY WITH D & C  09/05/2010   Procedure: DILATATION AND CURETTAGE (D&C) /HYSTEROSCOPY;  Surgeon: Robley Fries;  Location: [facility];  Service: Gynecology;  Laterality: Bilateral;  Dilataion & Currettage With Hysteroscopy; Polypectomy  . IUD REMOVAL  09/05/2010   Procedure: INTRAUTERINE DEVICE (IUD) REMOVAL;  Surgeon: Robley Fries;  Location: [facility];   Service: Gynecology;  Laterality: N/A;  Mirena IUD insertion.  Marland Kitchen LAPAROSCOPIC OVARIAN CYSTECTOMY  2000    Home Medications:  Allergies as of 04/16/2020   No Known Allergies     Medication List       Accurate as of April 16, 2020  3:14 PM. If you have any questions, ask your nurse or doctor.        amLODipine 5 MG tablet Commonly known as: NORVASC Take 5 mg by mouth daily.   atorvastatin 10 MG tablet Commonly known as: LIPITOR TAKE 1 TABLET BY MOUTH EVERYDAY AT BEDTIME   Ozempic (1 MG/DOSE) 2 MG/1.5ML Sopn Generic drug: Semaglutide (1 MG/DOSE) Inject 1 mg into the skin once a week.       Allergies: No Known Allergies  Family History: Family History  Problem Relation Age of Onset  . Colon cancer Other        paternal great uncle  . Hypertension Mother   . COPD Mother   . Heart disease Mother   . Diabetes Father   . Allergic rhinitis Neg Hx   . Angioedema Neg Hx   . Asthma Neg Hx   . Eczema Neg Hx   . Immunodeficiency Neg Hx   . Urticaria Neg Hx     Social History:  reports that she has  never smoked. She has never used smokeless tobacco. She reports that she does not drink alcohol and does not use drugs.   Physical Exam: BP 120/75   Pulse 90   Temp 98.3 F (36.8 C) (Oral)   Ht 5\' 1"  (1.549 m)   Wt 216 lb (98 kg)   LMP 11/16/2014 Comment: irregular  BMI 40.81 kg/m   Constitutional:  Alert and oriented, No acute distress. HEENT: [place] AT, moist mucus membranes.  Trachea midline, no masses. Cardiovascular: No clubbing, cyanosis, or edema. Respiratory: Normal respiratory effort, no increased work of breathing. GI: Abdomen is soft, nontender, nondistended, no abdominal masses GU: No CVA tenderness Lymph: No cervical or inguinal lymphadenopathy. Skin: No rashes, bruises or suspicious lesions. Neurologic: Grossly intact, no focal deficits, moving all 4 extremities. Psychiatric: Normal mood and affect.  Laboratory Data: Lab Results  Component Value Date    WBC 12.4 (H) 03/01/2020   HGB 12.5 03/01/2020   HCT 38.4 03/01/2020   MCV 89.7 03/01/2020   PLT 377 03/01/2020    Lab Results  Component Value Date   CREATININE 1.69 (H) 03/01/2020    No results found for: PSA  No results found for: TESTOSTERONE  Lab Results  Component Value Date   HGBA1C 5.6 07/01/2019    Urinalysis    Component Value Date/Time   COLORURINE STRAW (A) 03/01/2020 0600   APPEARANCEUR Clear 03/05/2020 1348   LABSPEC 1.012 03/01/2020 0600   PHURINE 7.0 03/01/2020 0600   GLUCOSEU Negative 03/05/2020 1348   HGBUR MODERATE (A) 03/01/2020 0600   BILIRUBINUR Negative 03/05/2020 1348   KETONESUR NEGATIVE 03/01/2020 0600   PROTEINUR Negative 03/05/2020 1348   PROTEINUR NEGATIVE 03/01/2020 0600   UROBILINOGEN 0.2 04/16/2012 0304   NITRITE Negative 03/05/2020 1348   NITRITE NEGATIVE 03/01/2020 0600   LEUKOCYTESUR Trace (A) 03/05/2020 1348   LEUKOCYTESUR NEGATIVE 03/01/2020 0600    Lab Results  Component Value Date   LABMICR See below: 03/05/2020   WBCUA 0-5 03/05/2020   LABEPIT 0-10 03/05/2020   BACTERIA None seen 03/05/2020    Pertinent Imaging: CT, 03/07/2020 - images reviewed  No results found for this or any previous visit.  No results found for this or any previous visit.  No results found for this or any previous visit.  No results found for this or any previous visit.  Results for orders placed during the hospital encounter of 03/30/20  04/01/20 RENAL  Narrative CLINICAL DATA:  Left hydronephrosis with left ureteral stone on CT  EXAM: RENAL / URINARY TRACT ULTRASOUND COMPLETE  COMPARISON:  CT abdomen/pelvis dated 03/01/2020  FINDINGS: Right Kidney:  Renal measurements: 11.0 x 5.9 x 5.9 cm = volume: 200 mL. Echogenicity within normal limits. 4.0 x 3.4 x 4.7 cm lower pole simple cyst. Additional 1.6 x 2.0 x 1.5 cm renal sinus cyst. No hydronephrosis.  Left Kidney:  Renal measurements: 11.7 x 5.7 x 6.2 cm = volume: 215 mL. Echogenicity  within normal limits. 10.0 x 6.5 x 10.3 cm lower pole simple cyst. No hydronephrosis.  Bladder:  Appears normal for degree of bladder distention.  Other:  1.9 cm gallstone in the gallbladder neck.  IMPRESSION: Bilateral renal cysts, measuring up to 10.3 cm in the left lower pole, benign.  No hydronephrosis.  Cholelithiasis, without associated inflammatory changes.   Electronically Signed By: 03/03/2020 M.D. On: 04/02/2020 09:46  No results found for this or any previous visit.  No results found for this or any previous visit.  Results for orders  placed during the hospital encounter of 03/01/20  CT Renal Stone Study  Narrative CLINICAL DATA:  Flank pain, left side.  Kidney stone suspected.  EXAM: CT ABDOMEN AND PELVIS WITHOUT CONTRAST  TECHNIQUE: Multidetector CT imaging of the abdomen and pelvis was performed following the standard protocol without IV contrast.  COMPARISON:  Ultrasound renal 01/18/2016.  FINDINGS: Lower chest: No acute abnormality.  Hepatobiliary: No focal liver abnormality. Suspected peripherally calcified gallstone within the gallbladder lumen. No gallbladder wall thickening or pericholecystic fluid. No biliary dilatation.  Pancreas: No focal lesion. Normal pancreatic contour. No surrounding inflammatory changes. No main pancreatic ductal dilatation.  Spleen: Normal in size without focal abnormality.  Adrenals/Urinary Tract: Trace left perinephric fat stranding. Moderate left hydronephrosis with associated 3 mm proximal left ureteral calcified stone. Proximal to the stone there is mild hydroureter. No nephrolithiasis bilaterally. No right ureterolithiasis.  A 9.5 cm fluid density lesion within the left kidney likely represents a simple renal cyst. Subcentimeter hypodensity within the left kidney is too small to characterize. Suggestion of renal cortical scarring (2:34, 5:63). Fluid density lesion noted within the right  inferior pole measuring up to 3.2 cm likely represents a simple renal cyst. Difficult to delineate suggestion of a right peripelvic cyst.  Stomach/Bowel: PO contrast reaches the rectum. Stomach is within normal limits. No evidence of bowel wall thickening or dilatation. Appendix appears normal.  Vascular/Lymphatic: Suggestion of phleboliths within the left pelvis (2:81). No abdominal aorta or iliac aneurysm. Mild atherosclerotic plaque of the aorta and its branches. No abdominal, pelvic, or inguinal lymphadenopathy.  Reproductive: Uterus and bilateral adnexa are unremarkable.  Other: No intraperitoneal free fluid. No intraperitoneal free gas. No organized fluid collection.  Musculoskeletal:  Possible tiny umbilical hernia.  No suspicious lytic or blastic osseous lesions. No acute displaced fracture. Multilevel degenerative changes of the spine.  IMPRESSION: 1. Obstructive 3 mm left proximal ureterolithiasis. Correlate with urinalysis to evaluate for a superimposed infection. 2. Possible cholelithiasis. If clinically indicated, consider right upper quadrant ultrasound for further evaluation.   Electronically Signed By: Tish Frederickson M.D. On: 03/01/2020 06:25   Assessment & Plan:    1. Renal cyst Discussed benign nature and will check an [place] in 1 year   2. Left ureteral stone - likely passed. No further pain or hydro.    No follow-ups on file.  Jerilee Field, MD  [facility]

## 2020-04-17 LAB — URINALYSIS, ROUTINE W REFLEX MICROSCOPIC
Bilirubin, UA: NEGATIVE
Glucose, UA: NEGATIVE
Ketones, UA: NEGATIVE
Leukocytes,UA: NEGATIVE
Nitrite, UA: NEGATIVE
Protein,UA: NEGATIVE
Specific Gravity, UA: >1.03 — ABNORMAL HIGH (ref 1.005–1.030)
Urobilinogen, Ur: 0.2 mg/dL (ref 0.2–1.0)
pH, UA: 5.5 (ref 5.0–7.5)

## 2020-04-17 LAB — MICROSCOPIC EXAMINATION: Renal Epithel, UA: NONE SEEN /hpf

## 2020-05-19 ENCOUNTER — Other Ambulatory Visit: Payer: Self-pay | Admitting: Family Medicine

## 2020-05-19 DIAGNOSIS — E782 Mixed hyperlipidemia: Secondary | ICD-10-CM

## 2020-08-17 ENCOUNTER — Other Ambulatory Visit: Payer: Self-pay | Admitting: Family Medicine

## 2020-08-17 DIAGNOSIS — E782 Mixed hyperlipidemia: Secondary | ICD-10-CM

## 2020-09-13 ENCOUNTER — Other Ambulatory Visit (HOSPITAL_COMMUNITY): Payer: Self-pay | Admitting: Family Medicine

## 2020-09-13 DIAGNOSIS — Z1231 Encounter for screening mammogram for malignant neoplasm of breast: Secondary | ICD-10-CM

## 2020-09-20 ENCOUNTER — Other Ambulatory Visit: Payer: Self-pay

## 2020-09-20 ENCOUNTER — Ambulatory Visit (HOSPITAL_COMMUNITY)
Admission: RE | Admit: 2020-09-20 | Discharge: 2020-09-20 | Disposition: A | Discharge status: Home / Self Care | Payer: Managed Care, Other (non HMO) | Source: Ambulatory Visit | Attending: Family Medicine | Admitting: Family Medicine

## 2020-09-20 DIAGNOSIS — Z1231 Encounter for screening mammogram for malignant neoplasm of breast: Secondary | ICD-10-CM | POA: Diagnosis present

## 2021-01-08 ENCOUNTER — Ambulatory Visit (INDEPENDENT_AMBULATORY_CARE_PROVIDER_SITE_OTHER): Payer: Managed Care, Other (non HMO)

## 2021-01-08 ENCOUNTER — Encounter (HOSPITAL_COMMUNITY): Payer: Self-pay | Admitting: Emergency Medicine

## 2021-01-08 ENCOUNTER — Other Ambulatory Visit: Payer: Self-pay

## 2021-01-08 ENCOUNTER — Ambulatory Visit (HOSPITAL_COMMUNITY)
Admission: EM | Admit: 2021-01-08 | Discharge: 2021-01-08 | Disposition: A | Discharge status: Home / Self Care | Payer: Managed Care, Other (non HMO) | Attending: Physician Assistant | Admitting: Physician Assistant

## 2021-01-08 DIAGNOSIS — J9811 Atelectasis: Secondary | ICD-10-CM | POA: Diagnosis not present

## 2021-01-08 DIAGNOSIS — W19XXXA Unspecified fall, initial encounter: Secondary | ICD-10-CM

## 2021-01-08 DIAGNOSIS — R0781 Pleurodynia: Secondary | ICD-10-CM | POA: Diagnosis not present

## 2021-01-08 DIAGNOSIS — R0782 Intercostal pain: Secondary | ICD-10-CM

## 2021-01-08 MED ORDER — TIZANIDINE HCL 4 MG PO CAPS: 4.0000 mg | ORAL_CAPSULE | Freq: Three times a day (TID) | ORAL | 0 refills | Status: AC | PRN

## 2021-01-08 MED ORDER — NAPROXEN 375 MG PO TABS: 375.0000 mg | ORAL_TABLET | Freq: Two times a day (BID) | ORAL | 0 refills | Status: AC

## 2021-01-08 NOTE — ED Triage Notes (Signed)
Pt reports carrying something down stairs on Friday and fell down about 6 stairs. Pt c/o left side/rib, back pains.

## 2021-01-08 NOTE — Discharge Instructions (Signed)
Your x-ray did not show any fractures which is great news.  It did show that the bottoms of your lungs are not inflated completely likely because you are not taking big deep breaths.  Use the incentive spirometer as we discussed.  I have called in Naprosyn to help with your pain.  Do not take NSAIDs including aspirin, ibuprofen/Advil, naproxen/Aleve with this medication as it can cause stomach bleeding.  Use Zanaflex up to 3 times a day.  This will make you sleepy so do not drive or drink alcohol while taking it.  If you develop any worsening symptoms you need to be seen immediately.

## 2021-01-08 NOTE — ED Provider Notes (Signed)
[facility]    CSN: [id number] Arrival date & time: 01/08/21  1042      History   Chief Complaint Chief Complaint  Patient presents with   Back Pain   Fall   Rib Injury    HPI Emily Aguirre is a 60 y.o. female.   Patient presents today with a several day history of left rib cage pain following injury.  Reports that approximately 5 days ago she was descending a staircase in flip-flops when she lost her balance and fell down several steps.  She is confident that she did not hit her head and denies any headache, loss of consciousness, amnesia surrounding event, nausea, vomiting, vision changes, dizziness.  She denies any neck pain or paresthesias in upper extremities.  Her primary concern today is left lateral rib pain that has been present since injury.  She has tried tramadol and previously prescribed muscle relaxer with temporary improvement of symptoms.  Reports pain is rated 7 on a 0-10 pain scale, localized to left chest wall and lateral rib cage, described as sharp, worse with certain movements or palpation, no alleviating factors notified.  She denies any shortness of breath, cough.  She is able to move upper extremities and perform daily tibias despite symptoms.   Past Medical History:  Diagnosis Date   Arthritis    bil knees   GERD (gastroesophageal reflux disease)    H/O abnormal Pap smear    H/O hematuria    Hypertension    Phreesia 06/28/2019    Patient Active Problem List   Diagnosis Date Noted   Hyperlipidemia 02/05/2018   Allergic reaction 04/04/2016   Hypertension 12/17/2015   Renal cyst 12/17/2015   Class 3 obesity ([place]) 01/11/2014   FH: colon cancer 04/22/2013   GERD (gastroesophageal reflux disease)     Past Surgical History:  Procedure Laterality Date   COLONOSCOPY N/A 05/20/2013   Procedure: COLONOSCOPY;  Surgeon: Danie Binder, MD;  Location: [facility];  Service: Endoscopy;  Laterality: N/A;  12:15-rescheduled to 8:30 Darius Bump to  notify pt   DILATION AND CURETTAGE OF UTERUS  2001   D&E   DILATION AND CURETTAGE OF UTERUS     x 2  due to misscarriages   HYSTEROSCOPY WITH D & C  09/05/2010   Procedure: DILATATION AND CURETTAGE (D&C) /HYSTEROSCOPY;  Surgeon: Elveria Royals;  Location: [facility];  Service: Gynecology;  Laterality: Bilateral;  Dil[place]ion & [place] With Hysteroscopy; Polypectomy   IUD REMOVAL  09/05/2010   Procedure: INTRAUTERINE DEVICE (IUD) REMOVAL;  Surgeon: Elveria Royals;  Location: [facility];  Service: Gynecology;  Laterality: N/A;  Mirena IUD insertion.   LAPAROSCOPIC OVARIAN CYSTECTOMY  2000    OB History     Gravida  2   Para      Term      Preterm      AB  2   Living  0      SAB  2   IAB      Ectopic      Multiple      Live Births  0            Home Medications    Prior to Admission medications   Medication Sig Start Date End Date Taking? Authorizing Provider  naproxen (NAPROSYN) 375 MG tablet Take 1 tablet (375 mg total) by mouth 2 (two) times daily. 01/08/21  Yes Jala Dundon K, PA-C  tiZANidine (ZANAFLEX) 4 MG capsule Take 1  capsule (4 mg total) by mouth 3 (three) times daily as needed for muscle spasms. 01/08/21  Yes Elex Mainwaring K, PA-C  amLODipine (NORVASC) 5 MG tablet Take 5 mg by mouth daily. 02/09/20   [provider]  atorvastatin (LIPITOR) 10 MG tablet TAKE 1 TABLET BY MOUTH EVERYDAY AT BEDTIME 05/21/20   Susy Frizzle, MD  Semaglutide, 1 MG/DOSE, (OZEMPIC, 1 MG/DOSE,) 2 MG/1.5ML SOPN Inject 1 mg into the skin once a week. 11/18/19   Alycia Rossetti, MD    Family History Family History  Problem Relation Age of Onset   Colon cancer Other        paternal great uncle   Hypertension Mother    COPD Mother    Heart disease Mother    Diabetes Father    Allergic rhinitis Neg Hx    Angioedema Neg Hx    Asthma Neg Hx    Eczema Neg Hx    Immunodeficiency Neg Hx    Urticaria Neg Hx     Social History Social History   Tobacco Use   Smoking  status: Never   Smokeless tobacco: Never  Vaping Use   Vaping Use: Never used  Substance Use Topics   Alcohol use: No   Drug use: No     Allergies   Patient has no known allergies.   Review of Systems Review of Systems  Constitutional:  Positive for activity change. Negative for appetite change, fatigue and fever.  Eyes:  Negative for photophobia and visual disturbance.  Respiratory:  Negative for cough and shortness of breath.   Cardiovascular:  Positive for chest pain (Rib pain).  Gastrointestinal:  Negative for abdominal pain, diarrhea, nausea and vomiting.  Musculoskeletal:  Negative for arthralgias, back pain and myalgias.  Neurological:  Negative for dizziness, weakness, light-headedness, numbness and headaches.    Physical Exam Triage Vital Signs ED Triage Vitals  Enc Vitals Group     BP 01/08/21 1127 (!) 153/83     Pulse Rate 01/08/21 1127 100     Resp 01/08/21 1127 19     Temp 01/08/21 1127 98.5 F (36.9 C)     Temp Source 01/08/21 1127 Oral     SpO2 01/08/21 1127 99 %     Weight --      Height --      Head Circumference --      Peak Flow --      Pain Score 01/08/21 1125 8     Pain Loc --      Pain Edu? --      Excl. in [place]? --    No data found.  Updated Vital Signs BP (!) 153/83 (BP Location: Right Arm)    Pulse 100    Temp 98.5 F (36.9 C) (Oral)    Resp 19    LMP 11/16/2014 Comment: irregular   SpO2 99%   Visual Acuity Right Eye Distance:   Left Eye Distance:   Bilateral Distance:    Right Eye Near:   Left Eye Near:    Bilateral Near:     Physical Exam Vitals reviewed.  Constitutional:      General: She is awake. She is not in acute distress.    Appearance: Normal appearance. She is well-developed. She is not ill-appearing.     Comments: Very pleasant female appears stated age in no acute distress sitting comfortably in exam room  HENT:     Head: Normocephalic and atraumatic. No raccoon eyes, Battle's sign or contusion.  Right Ear:  External ear normal.     Left Ear: External ear normal.  Eyes:     Extraocular Movements: Extraocular movements intact.     Pupils: Pupils are equal, round, and reactive to light.  Cardiovascular:     Rate and Rhythm: Normal rate and regular rhythm.     Heart sounds: Normal heart sounds, S1 normal and S2 normal. No murmur heard. Pulmonary:     Effort: Pulmonary effort is normal.     Breath sounds: Normal breath sounds. No wheezing, rhonchi or rales.     Comments: Clear to auscultation bilaterally Chest:     Chest wall: Tenderness present. No deformity or swelling.     Comments: Tenderness palpation over her left anterior and lateral chest wall.  No deformity noted. Abdominal:     Palpations: Abdomen is soft.     Tenderness: There is no abdominal tenderness.  Musculoskeletal:     Cervical back: No spinous process tenderness or muscular tenderness.  Lymphadenopathy:     Head:     Right side of head: No submental, submandibular or tonsillar adenopathy.     Left side of head: No submental, submandibular or tonsillar adenopathy.  Neurological:     General: No focal deficit present.     Motor: Motor function is intact.     Coordination: Coordination is intact.     Gait: Gait is intact.  Psychiatric:        Behavior: Behavior is cooperative.     UC Treatments / Results  Labs (all labs ordered are listed, but only abnormal results are displayed) Labs Reviewed - No data to display  EKG   Radiology DG Ribs Unilateral W/Chest Left  Result Date: 01/08/2021 CLINICAL DATA:  Pain after fall EXAM: LEFT RIBS AND CHEST - 3+ VIEW COMPARISON:  None. FINDINGS: No fracture or other bone lesions are seen involving the ribs. There is no evidence of pneumothorax or pleural effusion. Bibasilar opacities, likely due to atelectasis. No pleural effusion or pneumothorax. Heart size and mediastinal contours are within normal limits. IMPRESSION: 1. No displaced rib fracture. 2. Bibasilar opacities,  likely due to atelectasis. Electronically Signed   By: Allegra Lai M.D.   On: 01/08/2021 12:09    Procedures Procedures (including critical care time)  Medications Ordered in UC Medications - No data to display  Initial Impression / Assessment and Plan / UC Course  I have reviewed the triage vital signs and the nursing notes.  Pertinent labs & imaging results that were available during my care of the patient were reviewed by me and considered in my medical decision making (see chart for details).     No indication for head or cervical spine CT based on [place] CT rules.  Chest and rib x-ray was obtained given bony tenderness and mechanism of injury which showed no osseous abnormality.  Bibasilar atelectasis was noted and patient was given incentive spirometer in clinic with instruction on how to use this.  She was given Naprosyn with instruction not to take NSAIDs including aspirin, ibuprofen/Advil, naproxen/Aleve with this medication as it can cause stomach bleeding.  She was given Zanaflex for additional symptom relief with instruction not to drive or drink alcohol while taking this.  Recommended gentle stretch and heat for additional symptom relief.  We discussed alarm symptoms that warrant emergent evaluation.  Strict return precautions given to which she expressed understanding.  Final Clinical Impressions(s) / UC Diagnoses   Final diagnoses:  Fall, initial encounter  Rib pain  on left side  Atelectasis     Discharge Instructions      Your x-ray did not show any fractures which is great news.  It did show that the bottoms of your lungs are not inflated completely likely because you are not taking big deep breaths.  Use the incentive spirometer as we discussed.  I have called in Naprosyn to help with your pain.  Do not take NSAIDs including aspirin, ibuprofen/Advil, naproxen/Aleve with this medication as it can cause stomach bleeding.  Use Zanaflex up to 3 times a day.  This  will make you sleepy so do not drive or drink alcohol while taking it.  If you develop any worsening symptoms you need to be seen immediately.     ED Prescriptions     Medication Sig Dispense Auth. Provider   naproxen (NAPROSYN) 375 MG tablet Take 1 tablet (375 mg total) by mouth 2 (two) times daily. 20 tablet Regla Fitzgibbon K, PA-C   tiZANidine (ZANAFLEX) 4 MG capsule Take 1 capsule (4 mg total) by mouth 3 (three) times daily as needed for muscle spasms. 21 capsule Gavynn Duvall K, PA-C      PDMP not reviewed this encounter.   Terrilee Croak, PA-C 01/08/21 1234

## 2021-01-09 ENCOUNTER — Ambulatory Visit (HOSPITAL_COMMUNITY): Payer: Self-pay

## 2021-04-08 ENCOUNTER — Ambulatory Visit (HOSPITAL_COMMUNITY): Admission: RE | Admit: 2021-04-08 | Payer: Managed Care, Other (non HMO) | Source: Ambulatory Visit

## 2021-08-12 IMAGING — US US RENAL
1 series · 14 of 25 positions shown · non-contrast
Comparison: CT abdomen/pelvis dated 03/01/2020

CLINICAL DATA: Left hydronephrosis with left ureteral stone on CT

EXAM:
RENAL / URINARY TRACT ULTRASOUND COMPLETE

[Series 1: us renal · 14 of 54 slices shown]
[im 1/54]
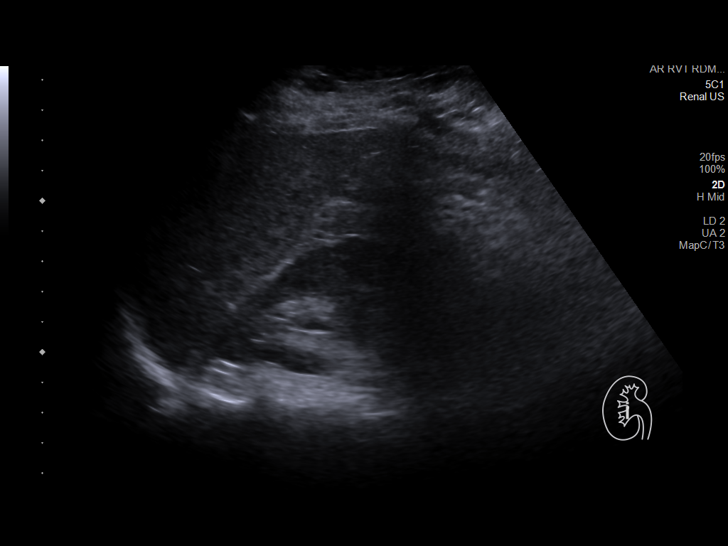
[im 5/54]
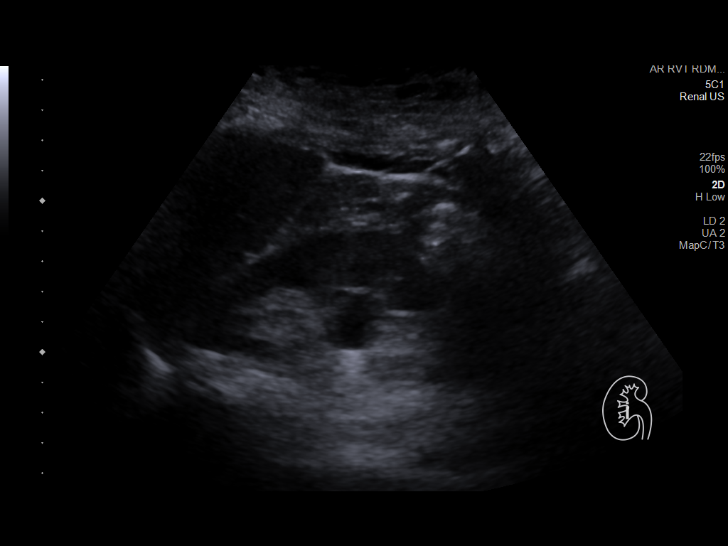
[im 9/54]
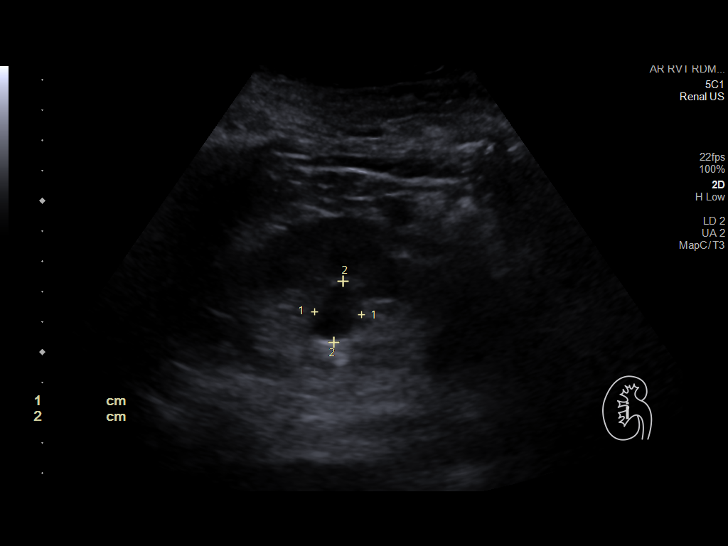
[im 14/54]
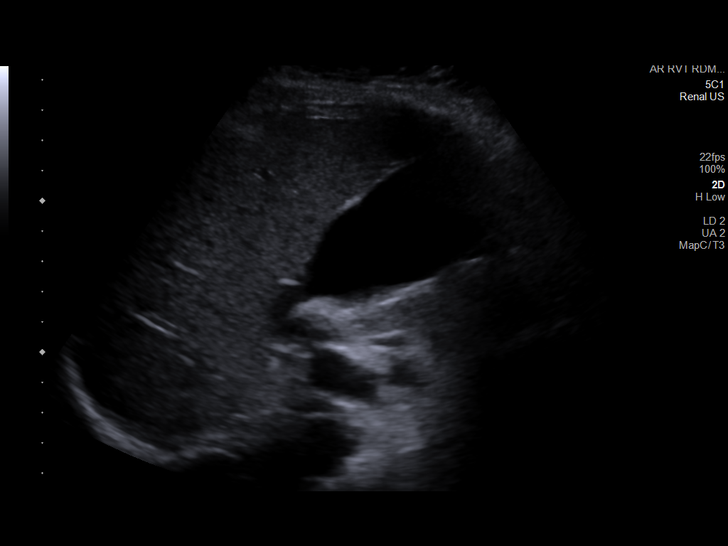
[im 18/54]
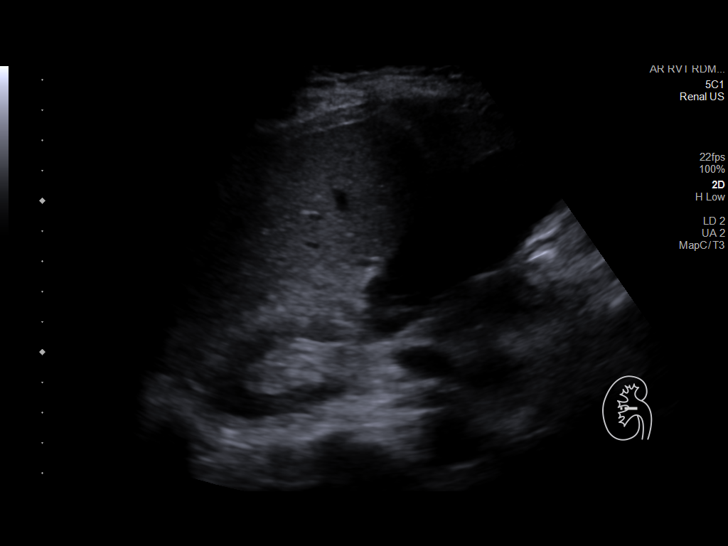
[im 20/54]
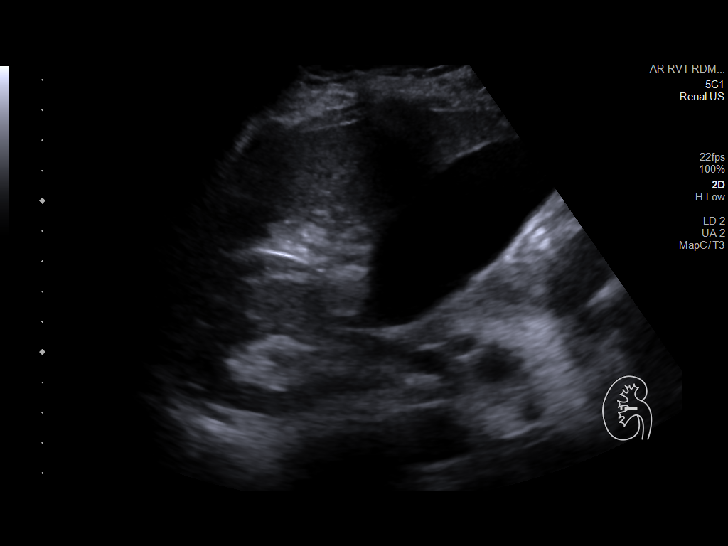
[im 25/54]
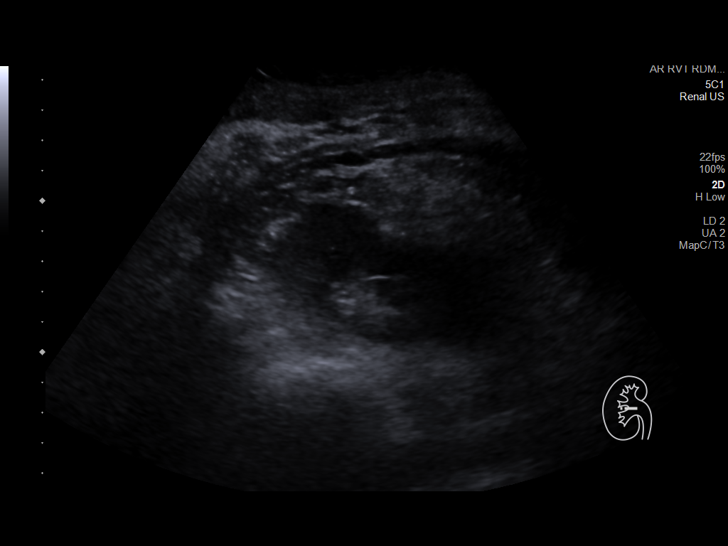
[im 29/54]
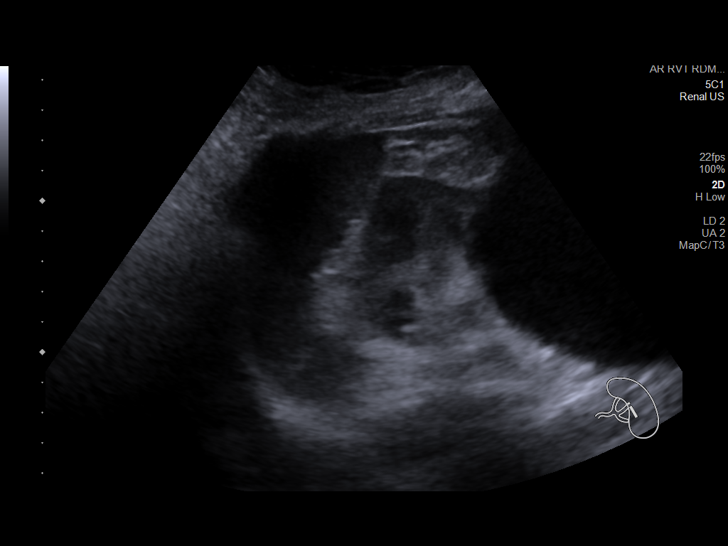
[im 34/54]
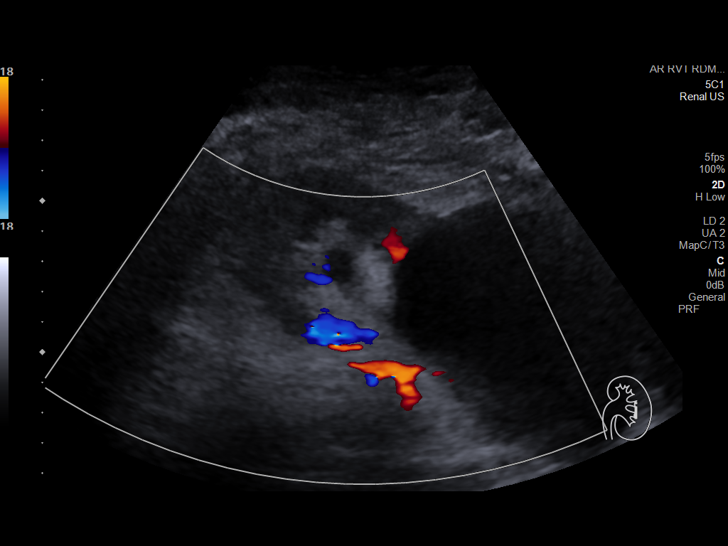
[im 36/54]
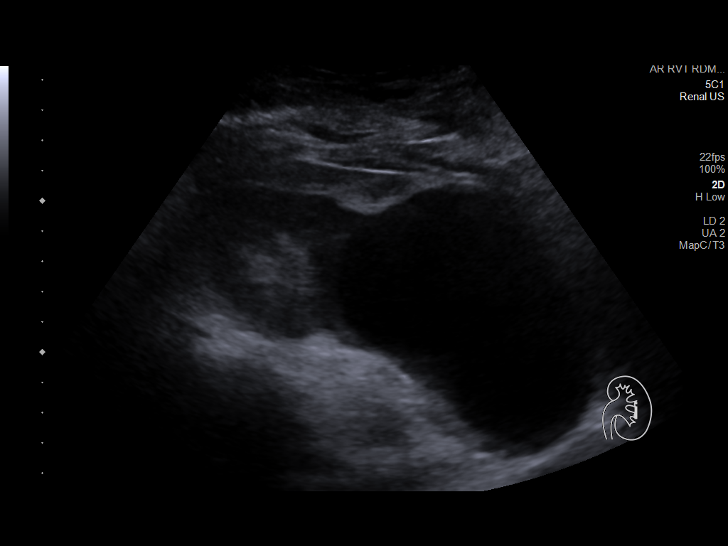
[im 40/54]
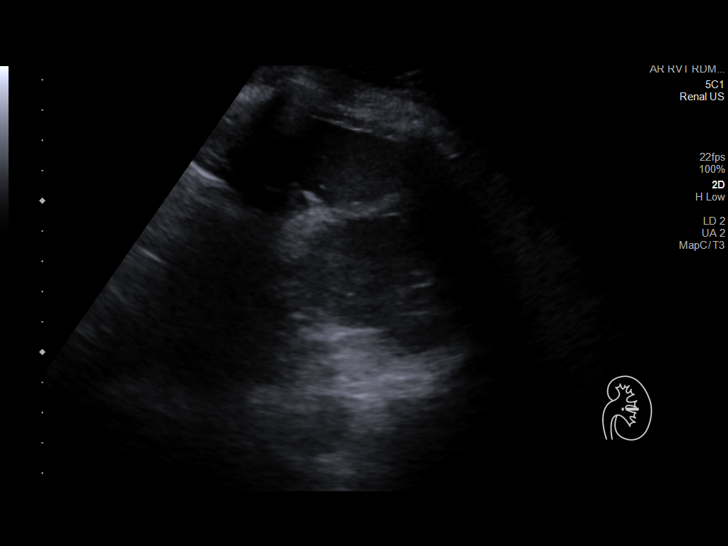
[im 45/54]
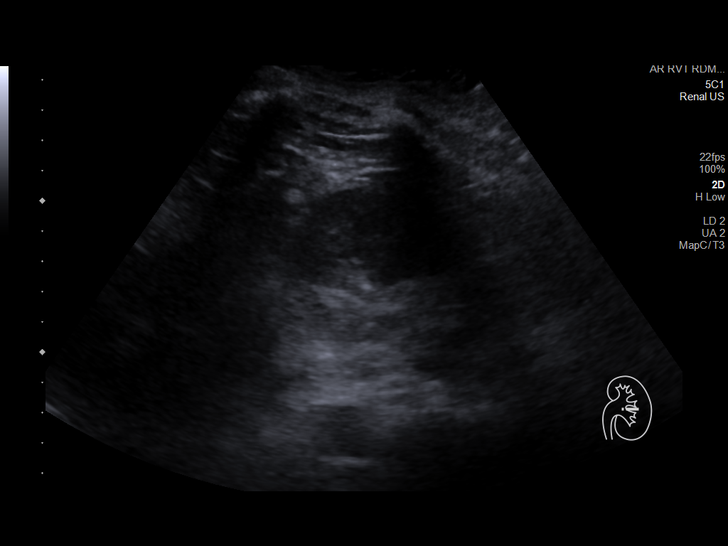
[im 49/54]
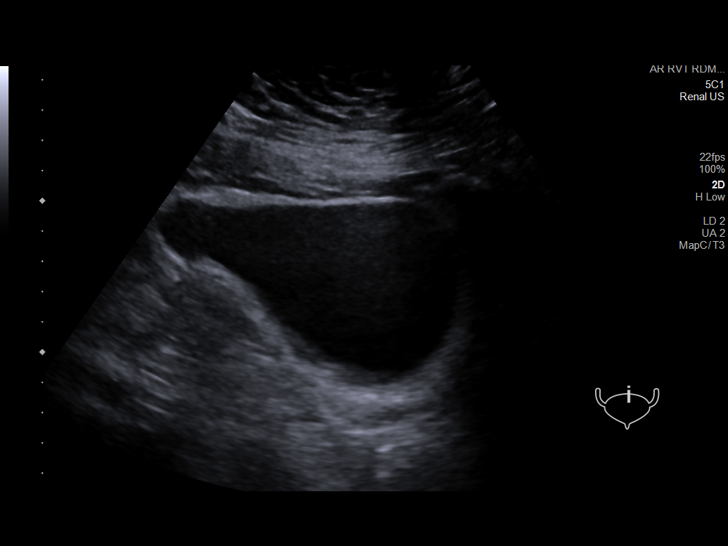
[im 54/54]
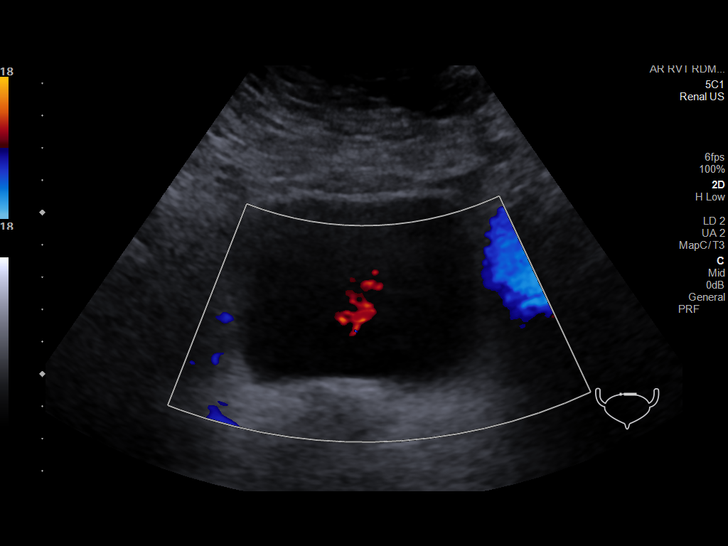

[14 of 25 positions shown; findings below may reference images not displayed]

FINDINGS: Right Kidney:

Renal measurements: 11.0 x 5.9 x 5.9 cm = volume: 200 mL.
Echogenicity within normal limits. 4.0 x 3.4 x 4.7 cm lower pole
simple cyst. Additional 1.6 x 2.0 x 1.5 cm renal sinus cyst. No
hydronephrosis.

Left Kidney:

Renal measurements: 11.7 x 5.7 x 6.2 cm = volume: 215 mL.
Echogenicity within normal limits. 10.0 x 6.5 x 10.3 cm lower pole
simple cyst. No hydronephrosis.

Bladder:

Appears normal for degree of bladder distention.

Other:

1.9 cm gallstone in the gallbladder neck.
IMPRESSION: Bilateral renal cysts, measuring up to 10.3 cm in the left lower
pole, benign.

No hydronephrosis.

Cholelithiasis, without associated inflammatory changes.

## 2021-11-18 ENCOUNTER — Other Ambulatory Visit (HOSPITAL_COMMUNITY): Payer: Self-pay | Admitting: Family Medicine

## 2021-11-18 DIAGNOSIS — Z1231 Encounter for screening mammogram for malignant neoplasm of breast: Secondary | ICD-10-CM

## 2021-11-27 ENCOUNTER — Ambulatory Visit (HOSPITAL_COMMUNITY)
Admission: RE | Admit: 2021-11-27 | Discharge: 2021-11-27 | Disposition: A | Discharge status: Home / Self Care | Payer: 59 | Source: Ambulatory Visit | Attending: Family Medicine | Admitting: Family Medicine

## 2021-11-27 DIAGNOSIS — Z1231 Encounter for screening mammogram for malignant neoplasm of breast: Secondary | ICD-10-CM | POA: Diagnosis not present

## 2022-12-29 ENCOUNTER — Other Ambulatory Visit (HOSPITAL_COMMUNITY): Payer: Self-pay | Admitting: Family Medicine

## 2022-12-29 DIAGNOSIS — Z1231 Encounter for screening mammogram for malignant neoplasm of breast: Secondary | ICD-10-CM

## 2022-12-31 ENCOUNTER — Ambulatory Visit (HOSPITAL_COMMUNITY)
Admission: RE | Admit: 2022-12-31 | Discharge: 2022-12-31 | Disposition: A | Discharge status: Home / Self Care | Payer: 59 | Source: Ambulatory Visit | Attending: Family Medicine | Admitting: Family Medicine

## 2022-12-31 DIAGNOSIS — Z1231 Encounter for screening mammogram for malignant neoplasm of breast: Secondary | ICD-10-CM | POA: Diagnosis present

## 2023-05-05 ENCOUNTER — Encounter: Payer: Self-pay | Admitting: *Deleted
